# Patient Record
Sex: Female | Born: 1985 | Race: Black or African American | Hispanic: No | Smoking: Never smoker
Health system: Southern US, Community
[De-identification: ages and names within clinical notes are randomized; demographics above are authoritative.]

## PROBLEM LIST (undated history)

## (undated) ENCOUNTER — Inpatient Hospital Stay (HOSPITAL_COMMUNITY): Payer: Self-pay

## (undated) ENCOUNTER — Inpatient Hospital Stay (HOSPITAL_COMMUNITY): Admission: RE | Source: Ambulatory Visit

## (undated) DIAGNOSIS — N390 Urinary tract infection, site not specified: Secondary | ICD-10-CM

## (undated) DIAGNOSIS — R7303 Prediabetes: Secondary | ICD-10-CM

## (undated) DIAGNOSIS — B999 Unspecified infectious disease: Secondary | ICD-10-CM

## (undated) DIAGNOSIS — R7309 Other abnormal glucose: Secondary | ICD-10-CM

## (undated) DIAGNOSIS — B379 Candidiasis, unspecified: Secondary | ICD-10-CM

## (undated) DIAGNOSIS — A749 Chlamydial infection, unspecified: Secondary | ICD-10-CM

## (undated) DIAGNOSIS — L03119 Cellulitis of unspecified part of limb: Secondary | ICD-10-CM

## (undated) HISTORY — DX: Unspecified infectious disease: B99.9

## (undated) HISTORY — PX: INDUCED ABORTION: SHX677

---
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
## 2003-03-12 ENCOUNTER — Encounter: Payer: Self-pay | Admitting: Emergency Medicine

## 2003-03-12 ENCOUNTER — Emergency Department (HOSPITAL_COMMUNITY): Admission: EM | Admit: 2003-03-12 | Discharge: 2003-03-12 | Payer: Self-pay | Admitting: Emergency Medicine

## 2007-09-29 DIAGNOSIS — A749 Chlamydial infection, unspecified: Secondary | ICD-10-CM

## 2007-09-29 HISTORY — DX: Chlamydial infection, unspecified: A74.9

## 2010-02-11 ENCOUNTER — Emergency Department (HOSPITAL_COMMUNITY): Admission: EM | Admit: 2010-02-11 | Discharge: 2010-02-11 | Payer: Self-pay | Admitting: Family Medicine

## 2010-07-30 ENCOUNTER — Other Ambulatory Visit: Admission: RE | Admit: 2010-07-30 | Discharge: 2010-07-30 | Payer: Self-pay | Admitting: Family Medicine

## 2010-08-20 ENCOUNTER — Other Ambulatory Visit: Admission: RE | Admit: 2010-08-20 | Discharge: 2010-08-20 | Payer: Self-pay | Admitting: Family Medicine

## 2010-09-28 DIAGNOSIS — L03119 Cellulitis of unspecified part of limb: Secondary | ICD-10-CM

## 2010-09-28 HISTORY — DX: Cellulitis of unspecified part of limb: L03.119

## 2010-12-15 LAB — POCT I-STAT, CHEM 8
Calcium, Ion: 1.15 mmol/L (ref 1.12–1.32)
Chloride: 106 mEq/L (ref 96–112)
Glucose, Bld: 101 mg/dL — ABNORMAL HIGH (ref 70–99)
HCT: 35 % — ABNORMAL LOW (ref 36.0–46.0)
Hemoglobin: 11.9 g/dL — ABNORMAL LOW (ref 12.0–15.0)
Potassium: 3.9 mEq/L (ref 3.5–5.1)

## 2011-02-05 ENCOUNTER — Ambulatory Visit
Admission: RE | Admit: 2011-02-05 | Discharge: 2011-02-05 | Disposition: A | Discharge status: Home / Self Care | Source: Ambulatory Visit | Attending: Family Medicine | Admitting: Family Medicine

## 2011-02-05 ENCOUNTER — Other Ambulatory Visit: Payer: Self-pay | Admitting: Family Medicine

## 2011-02-05 DIAGNOSIS — R6884 Jaw pain: Secondary | ICD-10-CM

## 2011-02-13 ENCOUNTER — Other Ambulatory Visit: Payer: Self-pay | Admitting: Podiatry

## 2011-06-11 ENCOUNTER — Other Ambulatory Visit (HOSPITAL_COMMUNITY)
Admission: RE | Admit: 2011-06-11 | Discharge: 2011-06-11 | Disposition: A | Discharge status: Home / Self Care | Source: Ambulatory Visit | Attending: Family Medicine | Admitting: Family Medicine

## 2011-06-11 ENCOUNTER — Other Ambulatory Visit: Payer: Self-pay | Admitting: Family Medicine

## 2011-06-11 DIAGNOSIS — Z01419 Encounter for gynecological examination (general) (routine) without abnormal findings: Secondary | ICD-10-CM | POA: Insufficient documentation

## 2012-01-19 ENCOUNTER — Other Ambulatory Visit: Payer: Self-pay | Admitting: Family Medicine

## 2012-01-19 ENCOUNTER — Other Ambulatory Visit (HOSPITAL_COMMUNITY)
Admission: RE | Admit: 2012-01-19 | Discharge: 2012-01-19 | Disposition: A | Discharge status: Home / Self Care | Source: Ambulatory Visit | Attending: Family Medicine | Admitting: Family Medicine

## 2012-01-19 DIAGNOSIS — Z01419 Encounter for gynecological examination (general) (routine) without abnormal findings: Secondary | ICD-10-CM | POA: Insufficient documentation

## 2012-03-17 ENCOUNTER — Encounter (HOSPITAL_COMMUNITY): Payer: Self-pay | Admitting: *Deleted

## 2012-03-17 ENCOUNTER — Inpatient Hospital Stay (HOSPITAL_COMMUNITY)
Admission: AD | Admit: 2012-03-17 | Discharge: 2012-03-17 | Disposition: A | Discharge status: Home / Self Care | Source: Ambulatory Visit | Attending: Obstetrics & Gynecology | Admitting: Obstetrics & Gynecology

## 2012-03-17 DIAGNOSIS — O209 Hemorrhage in early pregnancy, unspecified: Secondary | ICD-10-CM

## 2012-03-17 DIAGNOSIS — Z3201 Encounter for pregnancy test, result positive: Secondary | ICD-10-CM | POA: Insufficient documentation

## 2012-03-17 HISTORY — DX: Urinary tract infection, site not specified: N39.0

## 2012-03-17 HISTORY — DX: Chlamydial infection, unspecified: A74.9

## 2012-03-17 LAB — POCT PREGNANCY, URINE: Preg Test, Ur: POSITIVE — AB

## 2012-03-17 LAB — ABO/RH: ABO/RH(D): O POS

## 2012-03-17 MED ORDER — CONCEPT OB 130-92.4-1 MG PO CAPS: 1.0000 | ORAL_CAPSULE | ORAL | Status: DC

## 2012-03-17 NOTE — Discharge Instructions (Signed)
Vaginal Bleeding During Pregnancy  A small amount of bleeding from the vagina can happen anytime during pregnancy. Be sure to tell your doctor about all vaginal bleeding.   HOME CARE   Get plenty of rest and sleep.   Count the number of pads you use each day. Do not use tampons.   Save any tissue you pass for your doctor to see.   Do not exercise   Do not do any heavy lifting.   Avoid going up and down stairs. If you must climb stairs, go slowly.   Do not have sex (intercourse) or orgasms until approved by your doctor.   Do not douche.   Only take medicine as told by your doctor. Do not take aspirin.   Eat healthy.   Always keep your follow-up appointments.  GET HELP RIGHT AWAY IF:    You feel the baby moving less or not moving at all.   The bleeding gets worse.   You have very painful cramps or pain in your stomach or back.   You pass large clots or anything that looks like tissue.   You have a temperature by mouth above 102 F (38.9 C).   You feel very weak.   You have chills.   You feel dizzy or pass out (faint).   You have a gush of fluid from the vagina.  MAKE SURE YOU:    Understand these instructions.   Will watch your condition.   Will get help right away if you are not doing well or get worse.  Document Released: 06/23/2008 Document Revised: 09/03/2011 Document Reviewed: 08/20/2009  [employer] Patient Information 2012 [employer].

## 2012-03-17 NOTE — MAU Note (Signed)
Poe CNM confirmed pregnancy with heart beat with bedside ultrasound.

## 2012-03-17 NOTE — MAU Note (Addendum)
Took a home test last wk, was positive, just needs to confirm.  Had spotting on Mon, none since

## 2012-03-17 NOTE — MAU Provider Note (Signed)
Annette Jones y.o.[id number] @[redacted]w[redacted]d  by LMP Chief Complaint  Patient presents with  . Possible Pregnancy        SUBJECTIVE  HPI: Wants letter to verify pregnancy. She currently has no problems. Denies pain or bleeding, however she did have some pink streaking on toilet paper one week ago that occurred several times over 2 days. Mrs. Annette Jones. Care at [facility] Past Medical History  Diagnosis Date  . Diabetes mellitus     borderline  . Urinary tract infection   . Cellulitis   . Chlamydia    Past Surgical History  Procedure Date  . Induced abortion    History   Social History  . Marital Status: Married    Spouse Name: N/A    Number of Children: N/A  . Years of Education: N/A   Occupational History  . Not on file.   Social History Main Topics  . Smoking status: Former Smoker    Types: Cigarettes  . Smokeless tobacco: Never Used  . Alcohol Use: No  . Drug Use: No  . Sexually Active: Yes   Other Topics Concern  . Not on file   Social History Narrative  . No narrative on file   No current facility-administered medications on file prior to encounter.   No current outpatient prescriptions on file prior to encounter.   No Known Allergies  ROS: Pertinent items in HPI  OBJECTIVE Blood pressure 122/79, pulse 69, temperature 98 F (36.7 C), temperature source Oral, resp. rate 18, height 5\' 5"  (1.651 m), weight 96.163 kg (212 lb), last menstrual period 01/11/2012.  GENERAL: Well-developed, well-nourished female in no acute distress.  HEENT: Normocephalic, good dentition HEART: normal rate RESP: normal effort ABDOMEN: Soft, nontender EXTREMITIES: Nontender, no edema NEURO: Alert and oriented   LAB RESULTS  @RES24 @ Results for orders placed during the hospital encounter of 03/17/12 (from the past 24 hour(s))  POCT PREGNANCY, URINE     Status: Abnormal   Collection Time   03/17/12  3:45 PM      Component Value Range   Preg Test, Ur POSITIVE (*) NEGATIVE    ABO/RH     Status: Normal   Collection Time   03/17/12  4:15 PM      Component Value Range   ABO/RH(D) O POS     Blood Bank Correction       Value: PREVIOUSLY REPORTED AS: O NEG RESULT MODIFIED ON: [id number] @1900  FLEMINGS.BLDTYP BECK,S RN     IMAGING  Bedside US: pos FHR and fetal activity  ASSESSMENT Early pregnancy Minimal spotting last week        PLAN  Pregnancy verification letter, prenatal vitamin prescription. Start prenatal care at [facility] as soon as possible    Annette Jones 03/17/2012 7:22 PM

## 2012-04-13 ENCOUNTER — Ambulatory Visit (INDEPENDENT_AMBULATORY_CARE_PROVIDER_SITE_OTHER): Payer: Medicaid Other | Admitting: Obstetrics and Gynecology

## 2012-04-13 DIAGNOSIS — R7309 Other abnormal glucose: Secondary | ICD-10-CM

## 2012-04-13 DIAGNOSIS — R7303 Prediabetes: Secondary | ICD-10-CM

## 2012-04-13 DIAGNOSIS — Z331 Pregnant state, incidental: Secondary | ICD-10-CM

## 2012-04-13 LAB — POCT URINALYSIS DIPSTICK
Ketones, UA: NEGATIVE
Spec Grav, UA: 1.015
Urobilinogen, UA: NEGATIVE

## 2012-04-13 NOTE — Progress Notes (Signed)
EXPOSURE TO HAIR DYE 04/12/12

## 2012-04-14 ENCOUNTER — Encounter: Payer: Self-pay | Admitting: Obstetrics and Gynecology

## 2012-04-14 DIAGNOSIS — R7309 Other abnormal glucose: Secondary | ICD-10-CM

## 2012-04-14 HISTORY — DX: Other abnormal glucose: R73.09

## 2012-04-14 LAB — PRENATAL PANEL VII
Basophils Absolute: 0 10*3/uL (ref 0.0–0.1)
Basophils Relative: 0 % (ref 0–1)
Eosinophils Absolute: 0 10*3/uL (ref 0.0–0.7)
HIV: NONREACTIVE
Hemoglobin: 11.5 g/dL — ABNORMAL LOW (ref 12.0–15.0)
Hepatitis B Surface Ag: NEGATIVE
MCH: 27.8 pg (ref 26.0–34.0)
MCHC: 33.9 g/dL (ref 30.0–36.0)
Monocytes Relative: 9 % (ref 3–12)
Neutro Abs: 5 10*3/uL (ref 1.7–7.7)
Neutrophils Relative %: 67 % (ref 43–77)
Platelets: 287 10*3/uL (ref 150–400)

## 2012-04-14 LAB — HEMOGLOBIN A1C: Hgb A1c MFr Bld: 5.9 % — ABNORMAL HIGH (ref <5.7)

## 2012-04-14 LAB — CULTURE, OB URINE: Colony Count: NO GROWTH

## 2012-04-15 LAB — HEMOGLOBINOPATHY EVALUATION
Hemoglobin Other: 0 %
Hgb A2 Quant: 2.8 % (ref 2.2–3.2)
Hgb A: 97.2 % (ref 96.8–97.8)
Hgb F Quant: 0 % (ref 0.0–2.0)
Hgb S Quant: 0 %

## 2012-04-26 ENCOUNTER — Encounter: Payer: Medicaid Other | Admitting: Obstetrics and Gynecology

## 2012-05-02 ENCOUNTER — Encounter: Payer: Self-pay | Admitting: Obstetrics and Gynecology

## 2012-05-02 ENCOUNTER — Ambulatory Visit (INDEPENDENT_AMBULATORY_CARE_PROVIDER_SITE_OTHER): Payer: Medicaid Other | Admitting: Obstetrics and Gynecology

## 2012-05-02 VITALS — BP 100/68 | Wt 216.0 lb

## 2012-05-02 DIAGNOSIS — Z3490 Encounter for supervision of normal pregnancy, unspecified, unspecified trimester: Secondary | ICD-10-CM

## 2012-05-02 DIAGNOSIS — N760 Acute vaginitis: Secondary | ICD-10-CM

## 2012-05-02 DIAGNOSIS — E119 Type 2 diabetes mellitus without complications: Secondary | ICD-10-CM

## 2012-05-02 DIAGNOSIS — Z331 Pregnant state, incidental: Secondary | ICD-10-CM

## 2012-05-02 DIAGNOSIS — A499 Bacterial infection, unspecified: Secondary | ICD-10-CM

## 2012-05-02 LAB — POCT WET PREP (WET MOUNT)
Clue Cells Wet Prep Whiff POC: POSITIVE
WBC, Wet Prep HPF POC: NEGATIVE
pH: 4

## 2012-05-02 LAB — POCT OSOM TRICHOMONAS RAPID TEST: Trichomonas vaginalis: NEGATIVE

## 2012-05-02 LAB — POCT OSOM BVBLUE TEST: Bacterial Vaginosis: POSITIVE

## 2012-05-02 MED ORDER — METRONIDAZOLE 500 MG PO TABS: 500.0000 mg | ORAL_TABLET | Freq: Two times a day (BID) | ORAL | Status: AC

## 2012-05-02 NOTE — Progress Notes (Signed)
Pt transferred from Dr. Tedra [place] office no records will obtain No concerns per pt

## 2012-05-02 NOTE — Progress Notes (Signed)
[redacted]w[redacted]d Subjective:    Annette Jones is being seen today for her first obstetrical visit at [redacted]w[redacted]d gestation by USS with EDD 10/17/12.  She reports increase in Acne on face since pregnancy.  Her obstetrical history is significant for: TAB at 5 weeks  Patient Active Problem List  Diagnosis  . Elevated hemoglobin A1c  Borderline Diabetes prior to pregnancy.  Relationship with FOB:  Involved and supportive  Patient does intend to breast feed.   Pregnancy history fully reviewed.  The following portions of the patient's history were reviewed and updated as appropriate: allergies, current medications, past medical history, past social history, past surgical history and problem list.  Review of Systems Pertinent ROS is described in HPI   Objective:   BP 100/68  Wt 216 lb (97.977 kg)  LMP 01/11/2012 Wt Readings from Last 1 Encounters:  05/02/12 216 lb (97.977 kg)   BMI: There is no height on file to calculate BMI.  General: alert, cooperative and no distress Respiratory: clear to auscultation bilaterally Cardiovascular: regular rate and rhythm, S1, S2 normal, no murmur Breasts:  No dominant masses, nipples erect Gastrointestinal: soft, non-tender; no masses,  no organomegaly Extremities: extremities normal, no pain or edema Vaginal Bleeding: None  EXTERNAL GENITALIA: normal appearing vulva with no masses, tenderness or lesions VAGINA: no abnormal discharge or lesions CERVIX: no lesions or cervical motion tenderness; cervix closed, long, firm UTERUS: gravid and consistent with 16 weeks ADNEXA: no masses palpable and nontender OB EXAM PELVIMETRY: appears adequate   FHR:  145  bpm - Doppler  Assessment:    Pregnancy at  [redacted]w[redacted]d  Plan:     Prenatal panel reviewed and discussed with the patient:Yes. Pap smear collected: done April 2013 - normal GC/Chlamydia collected:done Wet prep:  PH 4.5, Oosum Test - Pos for BV Discussion of Genetic testing options: [facility]  Screen Prenatal vitamins recommended Problem list reviewed and updated.  Plan of care: Follow up in 2 weeks for Early 1hr GTT  Earl Gala CNM, [place] 05/02/2012 4:58 PM

## 2012-05-03 LAB — GC/CHLAMYDIA PROBE AMP, GENITAL: Chlamydia, DNA Probe: NEGATIVE

## 2012-05-16 ENCOUNTER — Ambulatory Visit (INDEPENDENT_AMBULATORY_CARE_PROVIDER_SITE_OTHER): Payer: Medicaid Other | Admitting: Obstetrics and Gynecology

## 2012-05-16 ENCOUNTER — Other Ambulatory Visit: Payer: Medicaid Other

## 2012-05-16 ENCOUNTER — Encounter: Payer: Self-pay | Admitting: Obstetrics and Gynecology

## 2012-05-16 VITALS — BP 100/60 | Wt 221.0 lb

## 2012-05-16 DIAGNOSIS — N898 Other specified noninflammatory disorders of vagina: Secondary | ICD-10-CM

## 2012-05-16 DIAGNOSIS — Z331 Pregnant state, incidental: Secondary | ICD-10-CM

## 2012-05-16 LAB — POCT WET PREP (WET MOUNT): PH, VAGINAL: 4

## 2012-05-16 NOTE — Progress Notes (Signed)
Pt without c/o Pt denies h/o of DM.  States she was borderline.  1 hr gtt today V/v exam WNL wet prep c/w yeast terazol given [place] for anantomy @NV  in 2 weeks Pt with LE swelling left is greater than right.  Neg homans sign B.  Do doppler of LE.  If neg rec supportive measures Doppler negative

## 2012-05-16 NOTE — Progress Notes (Signed)
Pt was given rx for bacterial infection and thinks it caused a yeast infection. Pt c/o vaginal irritation but no discharge.

## 2012-05-17 LAB — AFP, QUAD SCREEN
AFP: 46 IU/mL
Curr Gest Age: 18 wks.days
HCG, Total: 9416 m[IU]/mL
Interpretation-AFP: NEGATIVE
Open Spina bifida: NEGATIVE
uE3 Mom: 0.5
uE3 Value: 0.4 ng/mL

## 2012-05-23 ENCOUNTER — Other Ambulatory Visit: Payer: Self-pay | Admitting: Obstetrics and Gynecology

## 2012-05-23 ENCOUNTER — Ambulatory Visit (INDEPENDENT_AMBULATORY_CARE_PROVIDER_SITE_OTHER): Payer: Medicaid Other | Admitting: Obstetrics and Gynecology

## 2012-05-23 ENCOUNTER — Ambulatory Visit (INDEPENDENT_AMBULATORY_CARE_PROVIDER_SITE_OTHER): Payer: Medicaid Other

## 2012-05-23 ENCOUNTER — Encounter: Payer: Self-pay | Admitting: Obstetrics and Gynecology

## 2012-05-23 VITALS — BP 104/70 | Wt 220.0 lb

## 2012-05-23 DIAGNOSIS — K0889 Other specified disorders of teeth and supporting structures: Secondary | ICD-10-CM

## 2012-05-23 DIAGNOSIS — K089 Disorder of teeth and supporting structures, unspecified: Secondary | ICD-10-CM

## 2012-05-23 DIAGNOSIS — Z3400 Encounter for supervision of normal first pregnancy, unspecified trimester: Secondary | ICD-10-CM

## 2012-05-23 DIAGNOSIS — Z3689 Encounter for other specified antenatal screening: Secondary | ICD-10-CM

## 2012-05-23 NOTE — Progress Notes (Signed)
[redacted]w[redacted]d pt has no concerns.  Ultrasound shows:  Gestational age by [place]     SIUP  S=D     [place] EDD: 10/19/2012            AFI: nl            Cervical length: 4.43 cm            Placenta localization: anterior           Fetal presentation: vertex      Anatomy survey is normal    Anatomy complete:  yes            Gender : [id number] Comments: vertex presentation,anterior placenta (placenta edge is 4.4cm from internal os-normal). Fluid / norma vertical pocket= 4.4cm. No anomalies seen (profile, palate, philtrum, nasal bone, open hands, 5th digit, feet, heels seen. Patient request not to know gender at today's visit.  Normal ovaries, no fluid in CDS, normal adnexa's QUAD SCREEN: nl  DM:  Only dx test done before pregnancy was random cbg.  Pt never took oral agent.  She did have minimally elevated HGBa1c at [facility].    1 hr glucola done at [facility] nl  Rec: [facility] appt for diet.   3 hr gtt at 26-28 weeks   If 3 hr gtt is positive, would do fetal cardiac echo. DENTAL: C/o sore tooth.  Dentist appt recommended

## 2012-05-25 LAB — US OB COMP + 14 WK

## 2012-06-12 ENCOUNTER — Encounter (HOSPITAL_COMMUNITY): Payer: Self-pay | Admitting: *Deleted

## 2012-06-12 ENCOUNTER — Inpatient Hospital Stay (HOSPITAL_COMMUNITY)
Admission: AD | Admit: 2012-06-12 | Discharge: 2012-06-12 | DRG: 781 | Disposition: A | Discharge status: Home / Self Care | Source: Ambulatory Visit | Attending: Obstetrics and Gynecology | Admitting: Obstetrics and Gynecology

## 2012-06-12 DIAGNOSIS — O99810 Abnormal glucose complicating pregnancy: Secondary | ICD-10-CM

## 2012-06-12 DIAGNOSIS — B373 Candidiasis of vulva and vagina: Secondary | ICD-10-CM | POA: Diagnosis present

## 2012-06-12 DIAGNOSIS — E669 Obesity, unspecified: Secondary | ICD-10-CM | POA: Diagnosis present

## 2012-06-12 DIAGNOSIS — B9689 Other specified bacterial agents as the cause of diseases classified elsewhere: Secondary | ICD-10-CM | POA: Diagnosis present

## 2012-06-12 DIAGNOSIS — O99210 Obesity complicating pregnancy, unspecified trimester: Secondary | ICD-10-CM | POA: Diagnosis present

## 2012-06-12 DIAGNOSIS — A499 Bacterial infection, unspecified: Secondary | ICD-10-CM | POA: Diagnosis present

## 2012-06-12 DIAGNOSIS — N760 Acute vaginitis: Secondary | ICD-10-CM | POA: Diagnosis present

## 2012-06-12 DIAGNOSIS — O2390 Unspecified genitourinary tract infection in pregnancy, unspecified trimester: Principal | ICD-10-CM | POA: Diagnosis present

## 2012-06-12 DIAGNOSIS — B3731 Acute candidiasis of vulva and vagina: Secondary | ICD-10-CM | POA: Diagnosis present

## 2012-06-12 DIAGNOSIS — L293 Anogenital pruritus, unspecified: Secondary | ICD-10-CM | POA: Diagnosis present

## 2012-06-12 DIAGNOSIS — R7309 Other abnormal glucose: Secondary | ICD-10-CM | POA: Diagnosis present

## 2012-06-12 LAB — WET PREP, GENITAL

## 2012-06-12 MED ORDER — TERCONAZOLE 0.4 % VA CREA: 1.0000 | TOPICAL_CREAM | Freq: Every day | VAGINAL | Status: AC

## 2012-06-12 NOTE — MAU Note (Signed)
Pt presents for vaginal itching and a yellowish discharge that has some odor.  She states having BV in the middle of August and didn't complete course of medication due to stomach problems as a side effect.  She went to office afterwards and says they were supposed to call her in something for a yeast infection.  She went pick up Rx the next week and the rx was not called in.

## 2012-06-12 NOTE — MAU Provider Note (Signed)
History   Annette Jones is a 26y.o. Obese BF at [redacted]w[redacted]d who presents unannounced w/ CC of itching and "irritation," especially since last night, and "yellowish d/c, with yeasty odor." No fishy odor.  No VB.  IC a few days ago.  Treated in past month or so for BV; thinks she completed the one week course, but had trouble keeping it down.   Positive quickening.  Denies any Resp or gi c/o's today.  No UTI s/s.  No fever or chills.  Works in a group home; worked last night.  \ .. Patient Active Problem List  Diagnosis  . Elevated hemoglobin A1c  . Type 2 diabetes, HbA1C goal < 7%     CSN: [id number]  Arrival date and time: 06/12/12 4098   None     Chief Complaint  Patient presents with  . Vaginal Itching   HPI  OB History    Grav Para Term Preterm Abortions TAB SAB Ect Mult Living   2    1 0          Past Medical History  Diagnosis Date  . Urinary tract infection   . Cellulitis   . Chlamydia   . 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 Infection     UTI X 1  . Infection 2009    CHLAMYDIA  . Infection     HX OF FREQUENT YEAST  . Infection 2012    CELLULITIS    Past Surgical History  Procedure Date  . Induced abortion   . No past surgeries     Family History  Problem Relation Age of Onset  . Other Neg Hx   . Hypertension Mother   . Diabetes Maternal Grandmother   . Hypertension Maternal Grandmother   . Hypertension Cousin     History  Substance Use Topics  . Smoking status: Former Smoker    Types: Cigarettes  . Smokeless tobacco: Never Used  . Alcohol Use: No     2 GLASSES WINE /DAY; LAST DRANK 01/27/2012    Allergies: No Known Allergies  Prescriptions prior to admission  Medication Sig Dispense Refill  . acetaminophen (TYLENOL) 325 MG tablet Take 325 mg by mouth at bedtime as needed. For tooth pain      . benzocaine (ANBESOL) 10 % mucosal gel Use as directed 1 application in the mouth or throat as needed. For tooth pain      . Prenatal Vit-Fe  Fumarate-FA (PRENATAL MULTIVITAMIN) TABS Take 1 tablet by mouth daily.        ROS--see HPI above Physical Exam   Blood pressure 115/78, pulse 75, temperature 97.9 F (36.6 C), resp. rate 18, last menstrual period 01/11/2012.  Physical Exam  Constitutional: She is oriented to person, place, and time. She appears well-developed and well-nourished. No distress.  HENT:  Head: Normocephalic and atraumatic.  Eyes: Pupils are equal, round, and reactive to light.  Cardiovascular: Normal rate.   Respiratory: Effort normal.  GI: Soft.       Gravid; FHT's per RN  Genitourinary:       SSE:  Moderate amt of Thin white d/c in vault, w/ smaller clumps lining walls of vagina.  cx closed.  Positive whiff.  Neurological: She is alert and oriented to person, place, and time.  Skin: Skin is warm and dry.  .. Results for orders placed during the hospital encounter of 06/12/12 (from the past 24 hour(s))  WET PREP, GENITAL     Status: Abnormal  Collection Time   06/12/12  9:45 AM      Component Value Range   Yeast Wet Prep HPF POC MODERATE (*) NONE SEEN   Trich, Wet Prep NONE SEEN  NONE SEEN   Clue Cells Wet Prep HPF POC FEW (*) NONE SEEN   WBC, Wet Prep HPF POC FEW (*) NONE SEEN    MAU Course  Procedures 1.  Wet prep 2. gc/ct  Assessment and Plan  1. [redacted]w[redacted]d 2.  Yeast vaginitis 3.  Obese 4.  Borderline DM 5.  Possible BV  1.  D/c home w/ Rx for Terazol 7; will treat yeast first, and recheck wet prep at 06/20/12 appt for resolution and if persisting clue cells, will treat BV then. 2.  F/u prn any worsening s/s or concerns.  [place] Winslow H 06/12/2012, 10:55 AM

## 2012-06-12 NOTE — MAU Note (Signed)
Pt reports she was dx with BV 2 weeks ago. Did not finish the medication and feels like she has a yeast infection now.

## 2012-06-20 ENCOUNTER — Encounter: Payer: Self-pay | Admitting: Obstetrics and Gynecology

## 2012-06-20 ENCOUNTER — Ambulatory Visit (INDEPENDENT_AMBULATORY_CARE_PROVIDER_SITE_OTHER): Payer: Medicaid Other | Admitting: Obstetrics and Gynecology

## 2012-06-20 VITALS — BP 114/70 | Wt 223.0 lb

## 2012-06-20 DIAGNOSIS — O99810 Abnormal glucose complicating pregnancy: Secondary | ICD-10-CM

## 2012-06-20 DIAGNOSIS — Z331 Pregnant state, incidental: Secondary | ICD-10-CM

## 2012-06-20 NOTE — Progress Notes (Signed)
[redacted]w[redacted]d Pt is concerned if she is gaining enough weight. No other problems.

## 2012-06-20 NOTE — Progress Notes (Signed)
[redacted]w[redacted]d TWG 11# rv'd wgt gain recommendations  rv'd nutrition, increasing proteins and water intake, avoiding sugars C/o constipation, rec colace and miralax rec CBE and BF classes rv'd PTL sx's Will schedule for 3hr gtt in 4wks per [facility],  Also will send for nutrition consult  RTO 4wks

## 2012-06-20 NOTE — Patient Instructions (Signed)
Constipation in Adults Constipation is having fewer than 2 bowel movements per week. Usually, the stools are hard. As we grow older, constipation is more common. If you try to fix constipation with laxatives, the problem may get worse. This is because laxatives taken over a long period of time make the colon muscles weaker. A low-fiber diet, not taking in enough fluids, and taking some medicines may make these problems worse. MEDICATIONS THAT MAY CAUSE CONSTIPATION  Water pills (diuretics).   Calcium channel blockers (used to control blood pressure and for the heart).   Certain pain medicines (narcotics).   Anticholinergics.   Anti-inflammatory agents.   Antacids that contain aluminum.  DISEASES THAT CONTRIBUTE TO CONSTIPATION  Diabetes.   Parkinson's disease.   Dementia.   Stroke.   Depression.   Illnesses that cause problems with salt and water metabolism.  HOME CARE INSTRUCTIONS   Constipation is usually best cared for without medicines. Increasing dietary fiber and eating more fruits and vegetables is the best way to manage constipation.   Slowly increase fiber intake to 25 to 38 grams per day. Whole grains, fruits, vegetables, and legumes are good sources of fiber. A dietitian can further help you incorporate high-fiber foods into your diet.   Drink enough water and fluids to keep your urine clear or pale yellow.   A fiber supplement may be added to your diet if you cannot get enough fiber from foods.   Increasing your activities also helps improve regularity.   Suppositories, as suggested by your caregiver, will also help. If you are using antacids, such as aluminum or calcium containing products, it will be helpful to switch to products containing magnesium if your caregiver says it is okay.   If you have been given a liquid injection (enema) today, this is only a temporary measure. It should not be relied on for treatment of longstanding (chronic) constipation.    Stronger measures, such as magnesium sulfate, should be avoided if possible. This may cause uncontrollable diarrhea. Using magnesium sulfate may not allow you time to make it to the bathroom.  SEEK IMMEDIATE MEDICAL CARE IF:   There is bright red blood in the stool.   The constipation stays for more than 4 days.   There is belly (abdominal) or rectal pain.   You do not seem to be getting better.   You have any questions or concerns.  MAKE SURE YOU:   Understand these instructions.   Will watch your condition.   Will get help right away if you are not doing well or get worse.  Document Released: 06/12/2004 Document Revised: 09/03/2011 Document Reviewed: 08/18/2011 [facility] Patient Information 2012 [place], [place].  High Protein Diet A high protein diet means that high protein foods are added to your diet. Getting more protein in the diet is important for a number of reasons. Protein helps the body to build tissue, muscle, and to repair damage. People who have had surgery, injuries such as broken bones, infections, and burns, or illnesses such as cancer, may need more protein in their diet.  SERVING SIZES Measuring foods and serving sizes helps to make sure you are getting the right amount of food. The list below tells how big or small some common serving sizes are.   1 oz.........4 stacked dice.   3 oz........Marland KitchenDeck of cards.   1 tsp.......Marland KitchenTip of little finger.   1 tbs......Marland KitchenMarland KitchenThumb.   2 tbs.......Marland KitchenGolf ball.    cup......Marland KitchenHalf of a fist.   1 cup.......Marland KitchenA fist.  FOOD SOURCES OF PROTEIN Listed below are some food sources of protein and the amount of protein they contain. Your Registered Dietitian can calculate how many grams of protein you need for your medical condition. High protein foods can be added to the diet at mealtime or as snacks. Be sure to have at least 1 protein-containing food at each meal and snack to ensure adequate intake.  Meats and Meat Substitutes /  Protein (g)  3 oz poultry (chicken, [place]) / 26 g   3 oz tuna, canned in water / 26 g   3 oz fish (cod) / 21 g   3 oz red meat (beef, pork) / 21 g   4 oz tofu / 9 g   1 egg / 6 g    cup egg substitute / 5 g   1 cup dried beans / 15 g   1 cup soy milk / 4 g  Dairy / Protein (g)  1 cup milk (skim, 1%, 2%, whole) / 8 g    cup evaporated milk / 9 g   1 cup buttermilk / 8 g   1 cup low-fat plain yogurt / 11 g   1 cup regular plain yogurt / 9 g    cup cottage cheese / 14 g   1 oz cheddar cheese / 7 g  Nuts / Protein (g)  2 tbs peanut butter / 8 g   1 oz peanuts / 7 g   2 tbs cashews / 5 g   2 tbs almonds / 5 g  Document Released: 09/14/2005 Document Revised: 09/03/2011 Document Reviewed: 06/17/2007 [facility] Patient Information 2012 [place], [place].

## 2012-07-02 ENCOUNTER — Encounter: Payer: Self-pay | Admitting: Obstetrics and Gynecology

## 2012-07-02 DIAGNOSIS — R7309 Other abnormal glucose: Secondary | ICD-10-CM | POA: Insufficient documentation

## 2012-07-18 ENCOUNTER — Other Ambulatory Visit: Payer: Self-pay | Admitting: Obstetrics and Gynecology

## 2012-07-18 ENCOUNTER — Ambulatory Visit (INDEPENDENT_AMBULATORY_CARE_PROVIDER_SITE_OTHER): Payer: Medicaid Other | Admitting: Obstetrics and Gynecology

## 2012-07-18 VITALS — BP 90/56 | Wt 225.0 lb

## 2012-07-18 DIAGNOSIS — Z331 Pregnant state, incidental: Secondary | ICD-10-CM

## 2012-07-18 DIAGNOSIS — Z3490 Encounter for supervision of normal pregnancy, unspecified, unspecified trimester: Secondary | ICD-10-CM

## 2012-07-18 NOTE — Progress Notes (Signed)
[redacted]w[redacted]d Had 3 hr GTT today although 1 hr GTT was normal FH = dates FM+ No change in vaginal secretions ROB x 2 weeks

## 2012-07-18 NOTE — Progress Notes (Signed)
[redacted]w[redacted]d Pt completed 3 gtt today

## 2012-07-19 LAB — GLUCOSE TOLERANCE, 3 HOURS
Glucose Tolerance, 1 hour: 127 mg/dL (ref 70–189)
Glucose Tolerance, 2 hour: 93 mg/dL (ref 70–164)
Glucose Tolerance, Fasting: 86 mg/dL (ref 70–104)
Glucose, GTT - 3 Hour: 85 mg/dL (ref 70–144)

## 2012-08-01 ENCOUNTER — Ambulatory Visit (INDEPENDENT_AMBULATORY_CARE_PROVIDER_SITE_OTHER): Payer: Medicaid Other | Admitting: Obstetrics and Gynecology

## 2012-08-01 ENCOUNTER — Encounter: Payer: Self-pay | Admitting: Obstetrics and Gynecology

## 2012-08-01 VITALS — BP 90/60 | Wt 227.0 lb

## 2012-08-01 DIAGNOSIS — Z331 Pregnant state, incidental: Secondary | ICD-10-CM

## 2012-08-01 NOTE — Progress Notes (Signed)
Three hour gtt WNL [facility] reviewed [redacted]w[redacted]d

## 2012-08-01 NOTE — Patient Instructions (Signed)
Fetal Movement Counts Patient Name: __________________________________________________ Patient Due Date: ____________________ Kick counts is highly recommended in high risk pregnancies, but it is a good idea for every pregnant woman to do. Start counting fetal movements at 28 weeks of the pregnancy. Fetal movements increase after eating a full meal or eating or drinking something sweet (the blood sugar is higher). It is also important to drink plenty of fluids (well hydrated) before doing the count. Lie on your left side because it helps with the circulation or you can sit in a comfortable chair with your arms over your belly (abdomen) with no distractions around you. DOING THE COUNT  Try to do the count the same time of day each time you do it.  Mark the day and time, then see how long it takes for you to feel 10 movements (kicks, flutters, swishes, rolls). You should have at least 10 movements within 2 hours. You will most likely feel 10 movements in much less than 2 hours. If you do not, wait an hour and count again. After a couple of days you will see a pattern.  What you are looking for is a change in the pattern or not enough counts in 2 hours. Is it taking longer in time to reach 10 movements? SEEK MEDICAL CARE IF:  You feel less than 10 counts in 2 hours. Tried twice.  No movement in one hour.  The pattern is changing or taking longer each day to reach 10 counts in 2 hours.  You feel the baby is not moving as it usually does. Date: ____________ Movements: ____________ Start time: ____________ Finish time: ____________  Date: ____________ Movements: ____________ Start time: ____________ Finish time: ____________ Date: ____________ Movements: ____________ Start time: ____________ Finish time: ____________ Date: ____________ Movements: ____________ Start time: ____________ Finish time: ____________ Date: ____________ Movements: ____________ Start time: ____________ Finish time:  ____________ Date: ____________ Movements: ____________ Start time: ____________ Finish time: ____________ Date: ____________ Movements: ____________ Start time: ____________ Finish time: ____________ Date: ____________ Movements: ____________ Start time: ____________ Finish time: ____________  Date: ____________ Movements: ____________ Start time: ____________ Finish time: ____________ Date: ____________ Movements: ____________ Start time: ____________ Finish time: ____________ Date: ____________ Movements: ____________ Start time: ____________ Finish time: ____________ Date: ____________ Movements: ____________ Start time: ____________ Finish time: ____________ Date: ____________ Movements: ____________ Start time: ____________ Finish time: ____________ Date: ____________ Movements: ____________ Start time: ____________ Finish time: ____________ Date: ____________ Movements: ____________ Start time: ____________ Finish time: ____________  Date: ____________ Movements: ____________ Start time: ____________ Finish time: ____________ Date: ____________ Movements: ____________ Start time: ____________ Finish time: ____________ Date: ____________ Movements: ____________ Start time: ____________ Finish time: ____________ Date: ____________ Movements: ____________ Start time: ____________ Finish time: ____________ Date: ____________ Movements: ____________ Start time: ____________ Finish time: ____________ Date: ____________ Movements: ____________ Start time: ____________ Finish time: ____________ Date: ____________ Movements: ____________ Start time: ____________ Finish time: ____________  Date: ____________ Movements: ____________ Start time: ____________ Finish time: ____________ Date: ____________ Movements: ____________ Start time: ____________ Finish time: ____________ Date: ____________ Movements: ____________ Start time: ____________ Finish time: ____________ Date: ____________ Movements:  ____________ Start time: ____________ Finish time: ____________ Date: ____________ Movements: ____________ Start time: ____________ Finish time: ____________ Date: ____________ Movements: ____________ Start time: ____________ Finish time: ____________ Date: ____________ Movements: ____________ Start time: ____________ Finish time: ____________  Date: ____________ Movements: ____________ Start time: ____________ Finish time: ____________ Date: ____________ Movements: ____________ Start time: ____________ Finish time: ____________ Date: ____________ Movements: ____________ Start time: ____________ Finish time: ____________ Date: ____________ Movements:   ____________ Start time: ____________ Finish time: ____________ Date: ____________ Movements: ____________ Start time: ____________ Finish time: ____________ Date: ____________ Movements: ____________ Start time: ____________ Finish time: ____________ Date: ____________ Movements: ____________ Start time: ____________ Finish time: ____________  Date: ____________ Movements: ____________ Start time: ____________ Finish time: ____________ Date: ____________ Movements: ____________ Start time: ____________ Finish time: ____________ Date: ____________ Movements: ____________ Start time: ____________ Finish time: ____________ Date: ____________ Movements: ____________ Start time: ____________ Finish time: ____________ Date: ____________ Movements: ____________ Start time: ____________ Finish time: ____________ Date: ____________ Movements: ____________ Start time: ____________ Finish time: ____________ Date: ____________ Movements: ____________ Start time: ____________ Finish time: ____________  Date: ____________ Movements: ____________ Start time: ____________ Finish time: ____________ Date: ____________ Movements: ____________ Start time: ____________ Finish time: ____________ Date: ____________ Movements: ____________ Start time: ____________ Finish  time: ____________ Date: ____________ Movements: ____________ Start time: ____________ Finish time: ____________ Date: ____________ Movements: ____________ Start time: ____________ Finish time: ____________ Date: ____________ Movements: ____________ Start time: ____________ Finish time: ____________ Date: ____________ Movements: ____________ Start time: ____________ Finish time: ____________  Date: ____________ Movements: ____________ Start time: ____________ Finish time: ____________ Date: ____________ Movements: ____________ Start time: ____________ Finish time: ____________ Date: ____________ Movements: ____________ Start time: ____________ Finish time: ____________ Date: ____________ Movements: ____________ Start time: ____________ Finish time: ____________ Date: ____________ Movements: ____________ Start time: ____________ Finish time: ____________ Date: ____________ Movements: ____________ Start time: ____________ Finish time: ____________ Document Released: 10/14/2006 Document Revised: 12/07/2011 Document Reviewed: 04/16/2009 [employer] Patient Information 2013 [employer].  

## 2012-08-01 NOTE — Progress Notes (Signed)
Pt declines flu shot.  

## 2012-08-16 ENCOUNTER — Encounter: Payer: Self-pay | Admitting: Obstetrics and Gynecology

## 2012-08-16 ENCOUNTER — Ambulatory Visit (INDEPENDENT_AMBULATORY_CARE_PROVIDER_SITE_OTHER): Payer: Medicaid Other | Admitting: Obstetrics and Gynecology

## 2012-08-16 VITALS — BP 102/68 | Wt 228.0 lb

## 2012-08-16 DIAGNOSIS — Z331 Pregnant state, incidental: Secondary | ICD-10-CM

## 2012-08-16 NOTE — Progress Notes (Signed)
Pt c/o pain when standing after she sits for a long period of time. Pt is concerned about how much her baby weighs, thinks she is not gaining enough weight. Declines flu shot.

## 2012-08-16 NOTE — Patient Instructions (Signed)
Round Ligament Pain  The round ligament is made up of muscle and fibrous tissue. It is attached to the uterus near the fallopian tube. The round ligament is located on both sides of the uterus and helps support the position of the uterus. It usually begins in the second trimester of pregnancy when the uterus comes out of the pelvis. The pain can come and go until the baby is delivered. Round ligament pain is not a serious problem and does not cause harm to the baby.  CAUSE  During pregnancy the uterus grows the most from the second trimester to delivery. As it grows, it stretches and slightly twists the round ligaments. When the uterus leans from one side to the other, the round ligament on the opposite side pulls and stretches. This can cause pain.  SYMPTOMS   Pain can occur on one side or both sides. The pain is usually a short, sharp, and pinching-like. Sometimes it can be a dull, lingering and aching pain. The pain is located in the lower side of the abdomen or in the groin. The pain is internal and usually starts deep in the groin and moves up to the outside of the hip area. Pain can occur with:  · Sudden change in position like getting out of bed or a chair.  · Rolling over in bed.  · Coughing or sneezing.  · Walking too much.  · Any type of physical activity.  DIAGNOSIS   Your caregiver will make sure there are no serious problems causing the pain. When nothing serious is found, the symptoms usually indicate that the pain is from the round ligament.  TREATMENT   · Sit down and relax when the pain starts.  · Flex your knees up to your belly.  · Lay on your side with a pillow under your belly (abdomen) and another one between your legs.  · Sit in a hot bath for 15 to 20 minutes or until the pain goes away.  HOME CARE INSTRUCTIONS   · Only take over-the-counter or prescriptions medicines for pain, discomfort or fever as directed by your caregiver.  · Sit and stand slowly.  · Avoid long walks if it causes  pain.  · Stop or lessen your physical activities if it causes pain.  SEEK MEDICAL CARE IF:   · The pain does not go away with any of your treatment.  · You need stronger medication for the pain.  · You develop back pain that you did not have before with the side pain.  SEEK IMMEDIATE MEDICAL CARE IF:   · You develop a temperature of 102° F (38.9° C) or higher.  · You develop uterine contractions.  · You develop vaginal bleeding.  · You develop nausea, vomiting or diarrhea.  · You develop chills.  · You have pain when you urinate.  Document Released: 06/23/2008 Document Revised: 12/07/2011 Document Reviewed: 06/23/2008  [employer]® Patient Information ©2013 [employer].

## 2012-08-16 NOTE — Progress Notes (Signed)
[redacted]w[redacted]d Pt with occ soreness in her groin.  Reviewed round ligament pain Pt does not need [place] @ this time

## 2012-08-18 ENCOUNTER — Telehealth: Payer: Self-pay

## 2012-08-18 DIAGNOSIS — O26849 Uterine size-date discrepancy, unspecified trimester: Secondary | ICD-10-CM

## 2012-08-18 NOTE — Telephone Encounter (Signed)
Message copied by Rolla Plate on Thu Aug 18, 2012  4:04 PM ------      Message from: Henreitta Leber      Created: Tue Aug 16, 2012  1:11 PM      Regarding: schedule ultrasound       Per Dr. Normand Sloop,      Please schedule an ultrasound for history of diabetes, 3 hour GTT- normal (let patient know that it should be covered) and it may be schedule for her next visit. Thank you.      EP

## 2012-08-18 NOTE — Telephone Encounter (Signed)
Spoke with pt per ND need u/s for growth due to h/o diabetes pt has appt for u/s 08/30/12 at 3:00 the f/u with ND pt voice understanding

## 2012-08-30 ENCOUNTER — Ambulatory Visit (INDEPENDENT_AMBULATORY_CARE_PROVIDER_SITE_OTHER): Payer: Medicaid Other

## 2012-08-30 ENCOUNTER — Encounter: Payer: Self-pay | Admitting: Obstetrics and Gynecology

## 2012-08-30 ENCOUNTER — Ambulatory Visit (INDEPENDENT_AMBULATORY_CARE_PROVIDER_SITE_OTHER): Payer: Medicaid Other | Admitting: Obstetrics and Gynecology

## 2012-08-30 VITALS — BP 120/78 | Wt 234.0 lb

## 2012-08-30 DIAGNOSIS — Z331 Pregnant state, incidental: Secondary | ICD-10-CM

## 2012-08-30 DIAGNOSIS — O26849 Uterine size-date discrepancy, unspecified trimester: Secondary | ICD-10-CM

## 2012-08-30 NOTE — Progress Notes (Signed)
[place] EFW 4-6 57 % AFI 14.8 cm cx 3.26 cm vtx with anterior placenta Normal growth no signs of Dm with this pregnancy

## 2012-08-30 NOTE — Patient Instructions (Signed)
Fetal Movement Counts Patient Name: __________________________________________________ Patient Due Date: ____________________ Kick counts is highly recommended in high risk pregnancies, but it is a good idea for every pregnant woman to do. Start counting fetal movements at 28 weeks of the pregnancy. Fetal movements increase after eating a full meal or eating or drinking something sweet (the blood sugar is higher). It is also important to drink plenty of fluids (well hydrated) before doing the count. Lie on your left side because it helps with the circulation or you can sit in a comfortable chair with your arms over your belly (abdomen) with no distractions around you. DOING THE COUNT  Try to do the count the same time of day each time you do it.  Mark the day and time, then see how long it takes for you to feel 10 movements (kicks, flutters, swishes, rolls). You should have at least 10 movements within 2 hours. You will most likely feel 10 movements in much less than 2 hours. If you do not, wait an hour and count again. After a couple of days you will see a pattern.  What you are looking for is a change in the pattern or not enough counts in 2 hours. Is it taking longer in time to reach 10 movements? SEEK MEDICAL CARE IF:  You feel less than 10 counts in 2 hours. Tried twice.  No movement in one hour.  The pattern is changing or taking longer each day to reach 10 counts in 2 hours.  You feel the baby is not moving as it usually does. Date: ____________ Movements: ____________ Start time: ____________ Finish time: ____________  Date: ____________ Movements: ____________ Start time: ____________ Finish time: ____________ Date: ____________ Movements: ____________ Start time: ____________ Finish time: ____________ Date: ____________ Movements: ____________ Start time: ____________ Finish time: ____________ Date: ____________ Movements: ____________ Start time: ____________ Finish time:  ____________ Date: ____________ Movements: ____________ Start time: ____________ Finish time: ____________ Date: ____________ Movements: ____________ Start time: ____________ Finish time: ____________ Date: ____________ Movements: ____________ Start time: ____________ Finish time: ____________  Date: ____________ Movements: ____________ Start time: ____________ Finish time: ____________ Date: ____________ Movements: ____________ Start time: ____________ Finish time: ____________ Date: ____________ Movements: ____________ Start time: ____________ Finish time: ____________ Date: ____________ Movements: ____________ Start time: ____________ Finish time: ____________ Date: ____________ Movements: ____________ Start time: ____________ Finish time: ____________ Date: ____________ Movements: ____________ Start time: ____________ Finish time: ____________ Date: ____________ Movements: ____________ Start time: ____________ Finish time: ____________  Date: ____________ Movements: ____________ Start time: ____________ Finish time: ____________ Date: ____________ Movements: ____________ Start time: ____________ Finish time: ____________ Date: ____________ Movements: ____________ Start time: ____________ Finish time: ____________ Date: ____________ Movements: ____________ Start time: ____________ Finish time: ____________ Date: ____________ Movements: ____________ Start time: ____________ Finish time: ____________ Date: ____________ Movements: ____________ Start time: ____________ Finish time: ____________ Date: ____________ Movements: ____________ Start time: ____________ Finish time: ____________  Date: ____________ Movements: ____________ Start time: ____________ Finish time: ____________ Date: ____________ Movements: ____________ Start time: ____________ Finish time: ____________ Date: ____________ Movements: ____________ Start time: ____________ Finish time: ____________ Date: ____________ Movements:  ____________ Start time: ____________ Finish time: ____________ Date: ____________ Movements: ____________ Start time: ____________ Finish time: ____________ Date: ____________ Movements: ____________ Start time: ____________ Finish time: ____________ Date: ____________ Movements: ____________ Start time: ____________ Finish time: ____________  Date: ____________ Movements: ____________ Start time: ____________ Finish time: ____________ Date: ____________ Movements: ____________ Start time: ____________ Finish time: ____________ Date: ____________ Movements: ____________ Start time: ____________ Finish time: ____________ Date: ____________ Movements:   ____________ Start time: ____________ Finish time: ____________ Date: ____________ Movements: ____________ Start time: ____________ Finish time: ____________ Date: ____________ Movements: ____________ Start time: ____________ Finish time: ____________ Date: ____________ Movements: ____________ Start time: ____________ Finish time: ____________  Date: ____________ Movements: ____________ Start time: ____________ Finish time: ____________ Date: ____________ Movements: ____________ Start time: ____________ Finish time: ____________ Date: ____________ Movements: ____________ Start time: ____________ Finish time: ____________ Date: ____________ Movements: ____________ Start time: ____________ Finish time: ____________ Date: ____________ Movements: ____________ Start time: ____________ Finish time: ____________ Date: ____________ Movements: ____________ Start time: ____________ Finish time: ____________ Date: ____________ Movements: ____________ Start time: ____________ Finish time: ____________  Date: ____________ Movements: ____________ Start time: ____________ Finish time: ____________ Date: ____________ Movements: ____________ Start time: ____________ Finish time: ____________ Date: ____________ Movements: ____________ Start time: ____________ Finish  time: ____________ Date: ____________ Movements: ____________ Start time: ____________ Finish time: ____________ Date: ____________ Movements: ____________ Start time: ____________ Finish time: ____________ Date: ____________ Movements: ____________ Start time: ____________ Finish time: ____________ Date: ____________ Movements: ____________ Start time: ____________ Finish time: ____________  Date: ____________ Movements: ____________ Start time: ____________ Finish time: ____________ Date: ____________ Movements: ____________ Start time: ____________ Finish time: ____________ Date: ____________ Movements: ____________ Start time: ____________ Finish time: ____________ Date: ____________ Movements: ____________ Start time: ____________ Finish time: ____________ Date: ____________ Movements: ____________ Start time: ____________ Finish time: ____________ Date: ____________ Movements: ____________ Start time: ____________ Finish time: ____________ Document Released: 10/14/2006 Document Revised: 12/07/2011 Document Reviewed: 04/16/2009 [employer] Patient Information 2013 [employer].  

## 2012-08-30 NOTE — Progress Notes (Signed)
Pt declines flu shot.  

## 2012-09-02 LAB — US OB FOLLOW UP

## 2012-09-13 ENCOUNTER — Encounter: Payer: Self-pay | Admitting: Obstetrics and Gynecology

## 2012-09-13 ENCOUNTER — Ambulatory Visit (INDEPENDENT_AMBULATORY_CARE_PROVIDER_SITE_OTHER): Payer: Medicaid Other | Admitting: Obstetrics and Gynecology

## 2012-09-13 ENCOUNTER — Other Ambulatory Visit (INDEPENDENT_AMBULATORY_CARE_PROVIDER_SITE_OTHER): Payer: Medicaid Other

## 2012-09-13 VITALS — BP 120/70 | Wt 234.0 lb

## 2012-09-13 DIAGNOSIS — O26899 Other specified pregnancy related conditions, unspecified trimester: Secondary | ICD-10-CM

## 2012-09-13 DIAGNOSIS — Z3490 Encounter for supervision of normal pregnancy, unspecified, unspecified trimester: Secondary | ICD-10-CM

## 2012-09-13 DIAGNOSIS — Z3685 Encounter for antenatal screening for Streptococcus B: Secondary | ICD-10-CM

## 2012-09-13 DIAGNOSIS — Z331 Pregnant state, incidental: Secondary | ICD-10-CM

## 2012-09-13 DIAGNOSIS — Z113 Encounter for screening for infections with a predominantly sexual mode of transmission: Secondary | ICD-10-CM

## 2012-09-13 DIAGNOSIS — N898 Other specified noninflammatory disorders of vagina: Secondary | ICD-10-CM

## 2012-09-13 LAB — POCT WET PREP (WET MOUNT): Clue Cells Wet Prep Whiff POC: NEGATIVE

## 2012-09-13 NOTE — Progress Notes (Signed)
[redacted]w[redacted]d ?LOF Ph 4.5, - whiff, -N, -P spec with small amount of clear fluid U/S AFI 16.9cm, vtx, nl fluid RTO 1wk GBS/GC/CT all done today with consent FKCs Note for work secondary to cold for today and tomorrow

## 2012-09-14 ENCOUNTER — Telehealth: Payer: Self-pay | Admitting: Obstetrics and Gynecology

## 2012-09-14 NOTE — Telephone Encounter (Signed)
Tc to pt regarding msg.  Pt states it feels like she has fluid in her knees and feels as if she has a fever but has not checked her temperature, pt says she feels hot.  Pt states was told yesterday that she has a sinus infection and told to take Tylenol and particular type of cold medicine.  Pt reports +fm.  Pt advised to elevate feet above heart level, push fluids, rest, get a thermometer and take temp.  If temp 100.4 or greater to call office.  Pt voices agreement.

## 2012-09-16 ENCOUNTER — Telehealth: Payer: Self-pay

## 2012-09-16 LAB — CULTURE, BETA STREP (GROUP B ONLY)

## 2012-09-19 LAB — US OB LIMITED

## 2012-09-23 ENCOUNTER — Encounter: Payer: Self-pay | Admitting: Obstetrics and Gynecology

## 2012-09-23 ENCOUNTER — Ambulatory Visit (INDEPENDENT_AMBULATORY_CARE_PROVIDER_SITE_OTHER): Payer: Medicaid Other | Admitting: Obstetrics and Gynecology

## 2012-09-23 ENCOUNTER — Encounter (HOSPITAL_COMMUNITY): Payer: Self-pay | Admitting: *Deleted

## 2012-09-23 ENCOUNTER — Inpatient Hospital Stay (HOSPITAL_COMMUNITY)
Admission: AD | Admit: 2012-09-23 | Discharge: 2012-09-23 | Disposition: A | Discharge status: Home / Self Care | Source: Ambulatory Visit | Attending: Obstetrics and Gynecology | Admitting: Obstetrics and Gynecology

## 2012-09-23 VITALS — BP 112/80 | Wt 241.0 lb

## 2012-09-23 DIAGNOSIS — O99891 Other specified diseases and conditions complicating pregnancy: Secondary | ICD-10-CM | POA: Insufficient documentation

## 2012-09-23 DIAGNOSIS — O210 Mild hyperemesis gravidarum: Secondary | ICD-10-CM | POA: Insufficient documentation

## 2012-09-23 DIAGNOSIS — R7309 Other abnormal glucose: Secondary | ICD-10-CM | POA: Insufficient documentation

## 2012-09-23 DIAGNOSIS — Z331 Pregnant state, incidental: Secondary | ICD-10-CM

## 2012-09-23 DIAGNOSIS — E119 Type 2 diabetes mellitus without complications: Secondary | ICD-10-CM

## 2012-09-23 DIAGNOSIS — O219 Vomiting of pregnancy, unspecified: Secondary | ICD-10-CM

## 2012-09-23 LAB — URINALYSIS, ROUTINE W REFLEX MICROSCOPIC
Glucose, UA: NEGATIVE mg/dL
Ketones, ur: 15 mg/dL — AB
Nitrite: NEGATIVE
Protein, ur: NEGATIVE mg/dL

## 2012-09-23 LAB — URINE MICROSCOPIC-ADD ON

## 2012-09-23 MED ORDER — DEXTROSE 5 % IN LACTATED RINGERS IV BOLUS
1000.0000 mL | Freq: Once | INTRAVENOUS | Status: AC
  Administered 2012-09-23: 1000 mL via INTRAVENOUS

## 2012-09-23 MED ORDER — ONDANSETRON HCL 4 MG/2ML IJ SOLN: 4.0000 mg | Freq: Once | INTRAMUSCULAR | Status: DC

## 2012-09-23 MED ORDER — ONDANSETRON 8 MG PO TBDP
8.0000 mg | ORAL_TABLET | Freq: Once | ORAL | Status: AC
  Administered 2012-09-23: 8 mg via ORAL
  Filled 2012-09-23: qty 1

## 2012-09-23 MED ORDER — ONDANSETRON 8 MG PO TBDP: 8.0000 mg | ORAL_TABLET | Freq: Three times a day (TID) | ORAL | Status: DC | PRN

## 2012-09-23 MED ORDER — PANTOPRAZOLE SODIUM 40 MG IV SOLR
40.0000 mg | Freq: Once | INTRAVENOUS | Status: AC
  Administered 2012-09-23: 40 mg via INTRAVENOUS
  Filled 2012-09-23: qty 40

## 2012-09-23 NOTE — Progress Notes (Signed)
Pt c/o back pain and nausea, was seen at hospital this morning for nausea. Pt declines cervix check.

## 2012-09-23 NOTE — Patient Instructions (Signed)
Nausea and Vomiting Nausea is a sick feeling that often comes before throwing up (vomiting). Vomiting is a reflex where stomach contents come out of your mouth. Vomiting can cause severe loss of body fluids (dehydration). Children and elderly adults can become dehydrated quickly, especially if they also have diarrhea. Nausea and vomiting are symptoms of a condition or disease. It is important to find the cause of your symptoms. CAUSES   Direct irritation of the stomach lining. This irritation can result from increased acid production (gastroesophageal reflux disease), infection, food poisoning, taking certain medicines (such as nonsteroidal anti-inflammatory drugs), alcohol use, or tobacco use.  Signals from the brain.These signals could be caused by a headache, heat exposure, an inner ear disturbance, increased pressure in the brain from injury, infection, a tumor, or a concussion, pain, emotional stimulus, or metabolic problems.  An obstruction in the gastrointestinal tract (bowel obstruction).  Illnesses such as diabetes, hepatitis, gallbladder problems, appendicitis, kidney problems, cancer, sepsis, atypical symptoms of a heart attack, or eating disorders.  Medical treatments such as chemotherapy and radiation.  Receiving medicine that makes you sleep (general anesthetic) during surgery. DIAGNOSIS Your caregiver may ask for tests to be done if the problems do not improve after a few days. Tests may also be done if symptoms are severe or if the reason for the nausea and vomiting is not clear. Tests may include:  Urine tests.  Blood tests.  Stool tests.  Cultures (to look for evidence of infection).  X-rays or other imaging studies. Test results can help your caregiver make decisions about treatment or the need for additional tests. TREATMENT You need to stay well hydrated. Drink frequently but in small amounts.You may wish to drink water, sports drinks, clear broth, or eat frozen  ice pops or gelatin dessert to help stay hydrated.When you eat, eating slowly may help prevent nausea.There are also some antinausea medicines that may help prevent nausea. HOME CARE INSTRUCTIONS   Take all medicine as directed by your caregiver.  If you do not have an appetite, do not force yourself to eat. However, you must continue to drink fluids.  If you have an appetite, eat a normal diet unless your caregiver tells you differently.  Eat a variety of complex carbohydrates (rice, wheat, potatoes, bread), lean meats, yogurt, fruits, and vegetables.  Avoid high-fat foods because they are more difficult to digest.  Drink enough water and fluids to keep your urine clear or pale yellow.  If you are dehydrated, ask your caregiver for specific rehydration instructions. Signs of dehydration may include:  Severe thirst.  Dry lips and mouth.  Dizziness.  Dark urine.  Decreasing urine frequency and amount.  Confusion.  Rapid breathing or pulse. SEEK IMMEDIATE MEDICAL CARE IF:   You have blood or brown flecks (like coffee grounds) in your vomit.  You have black or bloody stools.  You have a severe headache or stiff neck.  You are confused.  You have severe abdominal pain.  You have chest pain or trouble breathing.  You do not urinate at least once every 8 hours.  You develop cold or clammy skin.  You continue to vomit for longer than 24 to 48 hours.  You have a fever. MAKE SURE YOU:   Understand these instructions.  Will watch your condition.  Will get help right away if you are not doing well or get worse. Document Released: 09/14/2005 Document Revised: 12/07/2011 Document Reviewed: 02/11/2011 Exit[employer] Patient Information 2013 [employer].  

## 2012-09-23 NOTE — MAU Note (Signed)
Pt reports she started feeling nauseated about 3 hours ago and then started vomiting about 2 hours,denies diarrhea, fever, vaginal bleeding or other complaints

## 2012-09-23 NOTE — Progress Notes (Signed)
[redacted]w[redacted]d Pt seen in MAU this morning for n/v.  She has not filled zofran yet Told to call if she can not tolerate any food or drink for 24 hrs with the zofran Pt declined cx exam

## 2012-09-28 NOTE — L&D Delivery Note (Signed)
Delivery Note Dr. Su Hilt at bedside and discussed vacuum delivery at onset of pushing. At 4:32 AM a viable female was delivered via Vaginal, Spontaneous Delivery (LOA Presentation). Easy delivery of the shoulders, crying, bulb suction mouth and nares, cord doubly clamped and baby taken to NICU at warm. APGAR: 9, 9; weight .   Placenta status:  Cord: 3 vessels.  Anesthesia: Epidural  Episiotomy:none Lacerations: R sulcus tear, R vag lac Suture Repair: vicryl 4.0 Est. Blood Loss (mL): 350  Mom to postpartum.  Baby to rooming in.  Vershawn Westrup 10/17/2012, 4:49 AM

## 2012-10-03 ENCOUNTER — Ambulatory Visit (INDEPENDENT_AMBULATORY_CARE_PROVIDER_SITE_OTHER): Payer: Medicaid Other | Admitting: Obstetrics and Gynecology

## 2012-10-03 ENCOUNTER — Encounter: Payer: Self-pay | Admitting: Obstetrics and Gynecology

## 2012-10-03 ENCOUNTER — Telehealth: Payer: Self-pay | Admitting: Obstetrics and Gynecology

## 2012-10-03 VITALS — BP 118/82 | Wt 234.0 lb

## 2012-10-03 DIAGNOSIS — Z331 Pregnant state, incidental: Secondary | ICD-10-CM

## 2012-10-03 NOTE — Progress Notes (Signed)
Pt c/o brown discharge.

## 2012-10-03 NOTE — Patient Instructions (Signed)

## 2012-10-03 NOTE — Progress Notes (Signed)
[redacted]w[redacted]d Speculum exam Brownish discharge, whiff neg, sperm on slide Pt desires to have metrogel for the odor.

## 2012-10-03 NOTE — Telephone Encounter (Signed)
LM to call at 3:44 ld

## 2012-10-04 ENCOUNTER — Telehealth: Payer: Self-pay

## 2012-10-04 NOTE — Telephone Encounter (Signed)
LMTC  ld 

## 2012-10-06 ENCOUNTER — Telehealth: Payer: Self-pay | Admitting: Advanced Practice Midwife

## 2012-10-06 NOTE — Telephone Encounter (Signed)
Asking how much ES Tylenol she can take for toothache. No fever. May take 1000 mg (2 tabs) Q6 PRN. Call dentists in am. Dentist may call office for any questions RE: Tx, Rx.

## 2012-10-10 ENCOUNTER — Encounter: Payer: Self-pay | Admitting: Obstetrics and Gynecology

## 2012-10-10 ENCOUNTER — Ambulatory Visit (INDEPENDENT_AMBULATORY_CARE_PROVIDER_SITE_OTHER): Payer: Medicaid Other | Admitting: Obstetrics and Gynecology

## 2012-10-10 VITALS — BP 122/80 | Wt 237.0 lb

## 2012-10-10 DIAGNOSIS — Z331 Pregnant state, incidental: Secondary | ICD-10-CM

## 2012-10-10 NOTE — Progress Notes (Signed)
Pt c/o increase in back pain, requests cervix check today.

## 2012-10-10 NOTE — Patient Instructions (Signed)

## 2012-10-10 NOTE — Progress Notes (Signed)
[redacted]w[redacted]d A/P GBS negative Fetal kick counts reviewed Labor reviewed with pt All patients  questions answered

## 2012-10-16 ENCOUNTER — Telehealth: Payer: Self-pay | Admitting: Obstetrics and Gynecology

## 2012-10-16 ENCOUNTER — Encounter (HOSPITAL_COMMUNITY): Payer: Self-pay

## 2012-10-16 ENCOUNTER — Inpatient Hospital Stay (HOSPITAL_COMMUNITY)
Admission: AD | Admit: 2012-10-16 | Discharge: 2012-10-19 | DRG: 775 | Disposition: A | Discharge status: Home / Self Care | Payer: Medicaid Other | Source: Ambulatory Visit | Attending: Obstetrics and Gynecology | Admitting: Obstetrics and Gynecology

## 2012-10-16 DIAGNOSIS — R7309 Other abnormal glucose: Secondary | ICD-10-CM

## 2012-10-16 DIAGNOSIS — Z22330 Carrier of Group B streptococcus: Secondary | ICD-10-CM

## 2012-10-16 DIAGNOSIS — O99892 Other specified diseases and conditions complicating childbirth: Secondary | ICD-10-CM | POA: Diagnosis present

## 2012-10-16 HISTORY — DX: Other abnormal glucose: R73.09

## 2012-10-16 NOTE — Telephone Encounter (Signed)
Pt called, having cramping this morning, noticed bloody show, no heavy bleeding, no LOF, GFM. Enc to continue observing, call if heavy bleeding or dec FM or in labor.

## 2012-10-16 NOTE — MAU Note (Signed)
Contractions every 2-3 minutes since 8pm tonight. Denies leaking of fluid or vaginal bleeding.

## 2012-10-17 ENCOUNTER — Inpatient Hospital Stay (HOSPITAL_COMMUNITY): Payer: Medicaid Other | Admitting: Anesthesiology

## 2012-10-17 ENCOUNTER — Encounter (HOSPITAL_COMMUNITY): Payer: Self-pay | Admitting: *Deleted

## 2012-10-17 ENCOUNTER — Encounter (HOSPITAL_COMMUNITY): Payer: Self-pay | Admitting: Anesthesiology

## 2012-10-17 LAB — COMPREHENSIVE METABOLIC PANEL
AST: 21 U/L (ref 0–37)
Albumin: 3.2 g/dL — ABNORMAL LOW (ref 3.5–5.2)
Alkaline Phosphatase: 127 U/L — ABNORMAL HIGH (ref 39–117)
BUN: 7 mg/dL (ref 6–23)
Chloride: 98 mEq/L (ref 96–112)
Creatinine, Ser: 0.71 mg/dL (ref 0.50–1.10)
Potassium: 4 mEq/L (ref 3.5–5.1)
Total Bilirubin: 0.2 mg/dL — ABNORMAL LOW (ref 0.3–1.2)
Total Protein: 7.2 g/dL (ref 6.0–8.3)

## 2012-10-17 LAB — URINALYSIS, ROUTINE W REFLEX MICROSCOPIC
Glucose, UA: NEGATIVE mg/dL
Leukocytes, UA: NEGATIVE
Specific Gravity, Urine: <1.005 — ABNORMAL LOW (ref 1.005–1.030)
pH: 6.5 (ref 5.0–8.0)

## 2012-10-17 LAB — LACTATE DEHYDROGENASE: LDH: 204 U/L (ref 94–250)

## 2012-10-17 LAB — URIC ACID: Uric Acid, Serum: 3.3 mg/dL (ref 2.4–7.0)

## 2012-10-17 LAB — CBC
HCT: 34 % — ABNORMAL LOW (ref 36.0–46.0)
Hemoglobin: 11.6 g/dL — ABNORMAL LOW (ref 12.0–15.0)
MCHC: 34.1 g/dL (ref 30.0–36.0)
RBC: 4.11 MIL/uL (ref 3.87–5.11)
WBC: 9.4 10*3/uL (ref 4.0–10.5)

## 2012-10-17 LAB — TYPE AND SCREEN
ABO/RH(D): O POS
Antibody Screen: NEGATIVE

## 2012-10-17 LAB — RPR: RPR Ser Ql: NONREACTIVE

## 2012-10-17 MED ORDER — WITCH HAZEL-GLYCERIN EX PADS: 1.0000 "application " | MEDICATED_PAD | CUTANEOUS | Status: DC | PRN

## 2012-10-17 MED ORDER — PRENATAL MULTIVITAMIN CH
1.0000 | ORAL_TABLET | Freq: Every day | ORAL | Status: DC
  Administered 2012-10-17 – 2012-10-19 (×3): 1 via ORAL
  Filled 2012-10-17 (×3): qty 1

## 2012-10-17 MED ORDER — ONDANSETRON HCL 4 MG/2ML IJ SOLN
4.0000 mg | Freq: Four times a day (QID) | INTRAMUSCULAR | Status: DC | PRN
  Filled 2012-10-17: qty 2

## 2012-10-17 MED ORDER — IBUPROFEN 600 MG PO TABS
600.0000 mg | ORAL_TABLET | Freq: Four times a day (QID) | ORAL | Status: DC
  Administered 2012-10-17 – 2012-10-19 (×8): 600 mg via ORAL
  Filled 2012-10-17 (×9): qty 1

## 2012-10-17 MED ORDER — BENZOCAINE-MENTHOL 20-0.5 % EX AERO
1.0000 "application " | INHALATION_SPRAY | CUTANEOUS | Status: DC | PRN
  Administered 2012-10-17: 1 via TOPICAL
  Filled 2012-10-17: qty 56

## 2012-10-17 MED ORDER — LACTATED RINGERS IV SOLN: 500.0000 mL | INTRAVENOUS | Status: DC | PRN

## 2012-10-17 MED ORDER — ZOLPIDEM TARTRATE 5 MG PO TABS: 5.0000 mg | ORAL_TABLET | Freq: Every evening | ORAL | Status: DC | PRN

## 2012-10-17 MED ORDER — ACETAMINOPHEN 325 MG PO TABS: 650.0000 mg | ORAL_TABLET | ORAL | Status: DC | PRN

## 2012-10-17 MED ORDER — OXYCODONE-ACETAMINOPHEN 5-325 MG PO TABS: 1.0000 | ORAL_TABLET | ORAL | Status: DC | PRN

## 2012-10-17 MED ORDER — PHENYLEPHRINE 40 MCG/ML (10ML) SYRINGE FOR IV PUSH (FOR BLOOD PRESSURE SUPPORT)
80.0000 ug | PREFILLED_SYRINGE | INTRAVENOUS | Status: DC | PRN
  Filled 2012-10-17: qty 5

## 2012-10-17 MED ORDER — LIDOCAINE HCL (PF) 1 % IJ SOLN
30.0000 mL | INTRAMUSCULAR | Status: DC | PRN
  Filled 2012-10-17: qty 30

## 2012-10-17 MED ORDER — SIMETHICONE 80 MG PO CHEW: 80.0000 mg | CHEWABLE_TABLET | ORAL | Status: DC | PRN

## 2012-10-17 MED ORDER — OXYTOCIN 40 UNITS IN LACTATED RINGERS INFUSION - SIMPLE MED
62.5000 mL/h | INTRAVENOUS | Status: DC
Start: 2012-10-17 — End: 2012-10-17
  Administered 2012-10-17: 62.5 mL/h via INTRAVENOUS
  Filled 2012-10-17: qty 1000

## 2012-10-17 MED ORDER — ONDANSETRON HCL 4 MG PO TABS: 4.0000 mg | ORAL_TABLET | ORAL | Status: DC | PRN

## 2012-10-17 MED ORDER — DIBUCAINE 1 % RE OINT: 1.0000 "application " | TOPICAL_OINTMENT | RECTAL | Status: DC | PRN

## 2012-10-17 MED ORDER — DIPHENHYDRAMINE HCL 50 MG/ML IJ SOLN: 12.5000 mg | INTRAMUSCULAR | Status: DC | PRN

## 2012-10-17 MED ORDER — OXYTOCIN 10 UNIT/ML IJ SOLN
INTRAMUSCULAR | Status: AC
  Administered 2012-10-17: 10 [IU]
  Filled 2012-10-17: qty 1

## 2012-10-17 MED ORDER — LANOLIN HYDROUS EX OINT: TOPICAL_OINTMENT | CUTANEOUS | Status: DC | PRN

## 2012-10-17 MED ORDER — OXYTOCIN BOLUS FROM INFUSION: 500.0000 mL | INTRAVENOUS | Status: DC

## 2012-10-17 MED ORDER — SODIUM BICARBONATE 8.4 % IV SOLN
INTRAVENOUS | Status: DC | PRN
  Administered 2012-10-17: 5 mL via EPIDURAL

## 2012-10-17 MED ORDER — EPHEDRINE 5 MG/ML INJ
10.0000 mg | INTRAVENOUS | Status: DC | PRN
  Filled 2012-10-17: qty 4

## 2012-10-17 MED ORDER — FERROUS SULFATE 325 (65 FE) MG PO TABS
325.0000 mg | ORAL_TABLET | Freq: Two times a day (BID) | ORAL | Status: DC
  Administered 2012-10-17 – 2012-10-19 (×4): 325 mg via ORAL
  Filled 2012-10-17 (×4): qty 1

## 2012-10-17 MED ORDER — PHENYLEPHRINE 40 MCG/ML (10ML) SYRINGE FOR IV PUSH (FOR BLOOD PRESSURE SUPPORT): 80.0000 ug | PREFILLED_SYRINGE | INTRAVENOUS | Status: DC | PRN

## 2012-10-17 MED ORDER — LACTATED RINGERS IV SOLN
INTRAVENOUS | Status: DC
  Administered 2012-10-17: via INTRAVENOUS

## 2012-10-17 MED ORDER — DIPHENHYDRAMINE HCL 25 MG PO CAPS: 25.0000 mg | ORAL_CAPSULE | Freq: Four times a day (QID) | ORAL | Status: DC | PRN

## 2012-10-17 MED ORDER — CITRIC ACID-SODIUM CITRATE 334-500 MG/5ML PO SOLN: 30.0000 mL | ORAL | Status: DC | PRN

## 2012-10-17 MED ORDER — TETANUS-DIPHTH-ACELL PERTUSSIS 5-2.5-18.5 LF-MCG/0.5 IM SUSP
0.5000 mL | Freq: Once | INTRAMUSCULAR | Status: AC
  Administered 2012-10-18: 0.5 mL via INTRAMUSCULAR

## 2012-10-17 MED ORDER — LACTATED RINGERS IV SOLN: 500.0000 mL | Freq: Once | INTRAVENOUS | Status: DC

## 2012-10-17 MED ORDER — EPHEDRINE 5 MG/ML INJ
10.0000 mg | INTRAVENOUS | Status: DC | PRN
  Administered 2012-10-17: 10 mg via INTRAVENOUS

## 2012-10-17 MED ORDER — MEASLES, MUMPS & RUBELLA VAC ~~LOC~~ INJ
0.5000 mL | INJECTION | Freq: Once | SUBCUTANEOUS | Status: DC
  Filled 2012-10-17: qty 0.5

## 2012-10-17 MED ORDER — ONDANSETRON HCL 4 MG/2ML IJ SOLN: 4.0000 mg | INTRAMUSCULAR | Status: DC | PRN

## 2012-10-17 MED ORDER — IBUPROFEN 600 MG PO TABS
600.0000 mg | ORAL_TABLET | Freq: Four times a day (QID) | ORAL | Status: DC | PRN
  Filled 2012-10-17: qty 1

## 2012-10-17 MED ORDER — FENTANYL 2.5 MCG/ML BUPIVACAINE 1/10 % EPIDURAL INFUSION (WH - ANES)
14.0000 mL/h | INTRAMUSCULAR | Status: DC
  Administered 2012-10-17: 14 mL/h via EPIDURAL
  Filled 2012-10-17: qty 125

## 2012-10-17 MED ORDER — SENNOSIDES-DOCUSATE SODIUM 8.6-50 MG PO TABS
2.0000 | ORAL_TABLET | Freq: Every day | ORAL | Status: DC
  Administered 2012-10-18 (×2): 2 via ORAL

## 2012-10-17 NOTE — Progress Notes (Signed)
Comfortable, some pressure O BP 137/93  Pulse 120  Temp 97.3 F (36.3 C) (Oral)  Resp 18  Ht 5\' 5"  (1.651 m)  Wt 243 lb (110.224 kg)  BMI 40.44 kg/m2  SpO2 100%  LMP 01/11/2012      fhts with variable decels with hypotension      abd soft between uc      Contractions mod q 2-4      Vag C +1 arm mod meconium scalp electrode placed easily with pt consent to sims position pih labs wnl ua pending A [redacted]w[redacted]d 2nd stage meconium P NICU at delivery, continue care Lavera Guise, CNM

## 2012-10-17 NOTE — H&P (Signed)
Annette Jones is a 27 y.o. female presenting for uc stronger at 830 pm, denies srom, with bloody mucus, with +FM, no ha, visual spots, or blurring, no upper abd pain, denies edema Maternal Medical History:  Reason for admission: Reason for admission: contractions.  Contractions: Onset was 3-5 hours ago.   Frequency: regular.   Perceived severity is moderate.    Fetal activity: Perceived fetal activity is normal.   Last perceived fetal movement was within the past hour.    Prenatal complications: No bleeding.     OB History    Grav Para Term Preterm Abortions TAB SAB Ect Mult Living   2    1 0        Abortion Past Medical History  Diagnosis Date  . Urinary tract infection   . Cellulitis   . Chlamydia   . 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 Infection     UTI X 1  . Infection 2009    CHLAMYDIA  . Infection     HX OF FREQUENT YEAST  . Infection 2012    CELLULITIS   Past Surgical History  Procedure Date  . Induced abortion   . No past surgeries    Family History: family history includes Diabetes in her maternal grandmother and Hypertension in her cousin, maternal grandmother, and mother.  There is no history of Other. Social History:  reports that she has quit smoking. Her smoking use included Cigarettes. She has never used smokeless tobacco. She reports that she does not drink alcohol or use illicit drugs.   Prenatal Transfer Tool  Maternal Diabetes: No Genetic Screening: Normal Maternal Ultrasounds/Referrals: Normal [place] with anterior placenta Fetal Ultrasounds or other Referrals:  None Maternal Substance Abuse:  Not assessed Significant Maternal Medications:  None Significant Maternal Lab Results:  Lab values include: Group B Strep negative Other Comments:  PIH labs pending  ROS  Dilation: 5 Effacement (%): 100 Station: -1 Exam by:: Lavera Guise CNM Blood pressure 129/77, pulse 65, temperature 97.4 F (36.3 C), temperature source Oral, resp. rate 20,  height 5\' 5"  (1.651 m), weight 243 lb (110.224 kg), last menstrual period 01/11/2012. Exam Physical Exam  Tense deep breathing with uc, cooperative, HEENT WNL grossly,  lungs clear bilaterally, AP RRR,  abd soft nt,no masses, not tympanic bowel sounds active,  trace edema to lower extremities DTRS bilaterally + 1 no clonus EFW 7#12 Vertex intact membranes Prenatal labs: ABO, Rh: O/POS/-- (07/17 1004) Antibody: NEG (07/17 1004) Rubella: 13.2 (07/17 1004) RPR: NON REAC (07/17 1004)  HBsAg: NEGATIVE (07/17 1004)  HIV: NON REACTIVE (07/17 1004)  GBS: Negative (12/17 0000)   Assessment/Plan: [redacted]w[redacted]d GBS + Routine admission, epidural per pt request, PIH labs pending, collaboration with Dr. Su Hilt.   Annette Jones 10/17/2012, 12:07 AM

## 2012-10-17 NOTE — Anesthesia Postprocedure Evaluation (Signed)
  Anesthesia Post-op Note  Patient: Annette Jones  Procedure(s) Performed: * No procedures listed *  Patient Location: Mother/Baby  Anesthesia Type:Epidural  Level of Consciousness: awake  Airway and Oxygen Therapy: Patient Spontanous Breathing  Post-op Pain: mild  Post-op Assessment: Patient's Cardiovascular Status Stable and Respiratory Function Stable  Post-op Vital Signs: stable  Complications: No apparent anesthesia complications

## 2012-10-17 NOTE — Progress Notes (Addendum)
0350 Severe variables C +2 called Dr. Su Hilt and coming now will plan not to push until MD in house, repositioned to semifowlers then to R side. Lavera Guise, CNM  Pt delivered a viable baby doing well.  I was asked to assess laceration and there was a shallow rt sulcus tear extending approx 4cm up vaginal wall that I repaired with 2-0 vicryl.  4-0 monocryl was also used for the remainder of the tear near the introitus.  No perineal laceration noted.  Pt tolerated repair well.

## 2012-10-17 NOTE — Anesthesia Procedure Notes (Signed)

## 2012-10-17 NOTE — Progress Notes (Signed)
UR chart review completed.  

## 2012-10-17 NOTE — Anesthesia Preprocedure Evaluation (Signed)
Anesthesia Evaluation  Patient identified by MRN, date of birth, ID band Patient awake    Reviewed: Allergy & Precautions, H&P , Patient's Chart, lab work & pertinent test results  Airway Mallampati: III  TM Distance: >3 FB Neck ROM: full    Dental  (+) Teeth Intact   Pulmonary  breath sounds clear to auscultation        Cardiovascular Rhythm:regular Rate:Normal     Neuro/Psych    GI/Hepatic   Endo/Other  Morbid obesity  Renal/GU      Musculoskeletal   Abdominal   Peds  Hematology   Anesthesia Other Findings       Reproductive/Obstetrics (+) Pregnancy                             Anesthesia Physical Anesthesia Plan  ASA: III  Anesthesia Plan: Epidural   Post-op Pain Management:    Induction:   Airway Management Planned:   Additional Equipment:   Intra-op Plan:   Post-operative Plan:   Informed Consent: I have reviewed the patients History and Physical, chart, labs and discussed the procedure including the risks, benefits and alternatives for the proposed anesthesia with the patient or authorized representative who has indicated his/her understanding and acceptance.   Dental Advisory Given  Plan Discussed with:   Anesthesia Plan Comments: (Labs checked- platelets confirmed with RN in room. Fetal heart tracing, per RN, reported to be stable enough for sitting procedure. Discussed epidural, and patient consents to the procedure:  included risk of possible headache,backache, failed block, allergic reaction, and nerve injury. This patient was asked if she had any questions or concerns before the procedure started.)        Anesthesia Quick Evaluation  

## 2012-10-18 ENCOUNTER — Encounter (HOSPITAL_COMMUNITY): Payer: Self-pay | Admitting: Obstetrics and Gynecology

## 2012-10-18 LAB — CBC
Hemoglobin: 10.3 g/dL — ABNORMAL LOW (ref 12.0–15.0)
MCHC: 33 g/dL (ref 30.0–36.0)
RDW: 13.7 % (ref 11.5–15.5)

## 2012-10-18 NOTE — Progress Notes (Signed)
Patient ID: Annette Jones, female   DOB: 1985-12-02, 27 y.o.   MRN: [id number] Post Partum Day1 Subjective: no complaints, voiding and tolerating PO and breast feeding  Objective: Afebrile VSS  Physical Exam:  General: alert Lochia: appropriate Uterine Fundus: firm  DVT Evaluation: No evidence of DVT seen on physical exam.   Basename 10/18/12 0455 10/16/12 2359  HGB 10.3* 11.6*  HCT 31.2* 34.0*    Assessment/Plan: PPD 1 Ptbreast and bottle feeding Routine care   LOS: 2 days   Annette Jones A 10/18/2012, 2:00 PM

## 2012-10-19 ENCOUNTER — Encounter: Payer: Medicaid Other | Admitting: Obstetrics and Gynecology

## 2012-10-19 MED ORDER — HYDROCODONE-ACETAMINOPHEN 5-500 MG PO TABS: 1.0000 | ORAL_TABLET | Freq: Four times a day (QID) | ORAL | Status: DC | PRN

## 2012-10-19 MED ORDER — PRENATAL MULTIVITAMIN CH: 1.0000 | ORAL_TABLET | Freq: Every day | ORAL | Status: DC

## 2012-10-19 MED ORDER — IBUPROFEN 600 MG PO TABS
600.0000 mg | ORAL_TABLET | Freq: Four times a day (QID) | ORAL | Status: DC | PRN
Start: 2012-10-19 — End: 2014-07-20

## 2012-10-19 NOTE — Discharge Summary (Signed)
Obstetric Discharge Summary Reason for Admission: onset of labor Prenatal Procedures: ultrasound Intrapartum Procedures: spontaneous vaginal delivery Postpartum Procedures: none Complications-Operative and Postpartum: none no complaints except a little urgency Hemoglobin  Date Value Range Status  10/18/2012 10.3* 12.0 - 15.0 g/dL Final     HCT  Date Value Range Status  10/18/2012 31.2* 36.0 - 46.0 % Final    Physical Exam:  General: alert and no distress Lochia: appropriate Uterine Fundus: firm DVT Evaluation: No evidence of DVT seen on physical exam.  Discharge Diagnoses: Term Pregnancy-delivered  Discharge Information: Date: 10/19/2012 Activity: pelvic rest Diet: routine Medications: PNV, Ibuprofen and Vicodin Plans OCPs at [facility] Visit (attempting to BF) Condition: stable Instructions: refer to practice specific booklet Discharge to: home Cont to monitor urgency.  If not improving call office for visit to check for UTI.  Newborn Data: Live born female  Birth Weight: 5 lb 3.4 oz (2364 g) APGAR: 9, 9  Home with mother.  Annette Jones Y 10/19/2012, 9:06 AM

## 2012-10-20 ENCOUNTER — Telehealth: Payer: Self-pay | Admitting: Obstetrics and Gynecology

## 2012-10-20 ENCOUNTER — Other Ambulatory Visit: Payer: Self-pay | Admitting: Obstetrics and Gynecology

## 2012-10-20 MED ORDER — HYDROCODONE-ACETAMINOPHEN 5-325 MG PO TABS: 1.0000 | ORAL_TABLET | Freq: Four times a day (QID) | ORAL | Status: DC | PRN

## 2012-10-20 NOTE — Telephone Encounter (Signed)
Tc to [facility] pharmacy regarding msg from the pharmacist Tisha.  Vicodin 5-500 mg was rx'd after pt was d/c'd from [facility] for delivery, they no longer make that dosage, recommended 5-325 mg.  Consulted w/ AVS, per AVS ok to rx, pt's rx called to [facility] and left on vm.

## 2012-10-25 ENCOUNTER — Encounter: Payer: Self-pay | Admitting: Obstetrics and Gynecology

## 2012-10-25 ENCOUNTER — Telehealth: Payer: Self-pay | Admitting: Obstetrics and Gynecology

## 2012-10-25 ENCOUNTER — Ambulatory Visit: Payer: Medicaid Other | Admitting: Obstetrics and Gynecology

## 2012-10-25 VITALS — BP 120/82 | Wt 218.0 lb

## 2012-10-25 DIAGNOSIS — S3141XA Laceration without foreign body of vagina and vulva, initial encounter: Secondary | ICD-10-CM

## 2012-10-25 DIAGNOSIS — O8620 Urinary tract infection following delivery, unspecified: Secondary | ICD-10-CM

## 2012-10-25 DIAGNOSIS — R300 Dysuria: Secondary | ICD-10-CM

## 2012-10-25 LAB — POCT URINALYSIS DIPSTICK
Ketones, UA: 2
Protein, UA: 3
Urobilinogen, UA: NEGATIVE

## 2012-10-25 MED ORDER — PHENAZOPYRIDINE HCL 200 MG PO TABS: 200.0000 mg | ORAL_TABLET | Freq: Three times a day (TID) | ORAL | Status: DC

## 2012-10-25 MED ORDER — CEPHALEXIN 500 MG PO CAPS: 500.0000 mg | ORAL_CAPSULE | Freq: Two times a day (BID) | ORAL | Status: AC

## 2012-10-25 MED ORDER — HYDROCODONE-ACETAMINOPHEN 5-325 MG PO TABS: 1.0000 | ORAL_TABLET | ORAL | Status: DC | PRN

## 2012-10-25 NOTE — Telephone Encounter (Signed)
TC to [place] at [place] in response to message. Ok to prescribe generic Norco for Vocodin. Verified same strength.

## 2012-10-25 NOTE — Progress Notes (Signed)
Here at 8 days s/p SVB c/o dysuria and frequency.   Denies fever or back pain.  Had catheter during labor. Also had repair of a right sulcus tear by Dr. Su Hilt, but no perineal involvement. Had Rx for 5/500 Vicodin, but pharmacy did not fill due to increased acetaminophen amount.  Perineum clear Vaginal laceration healing well, with no sx of infection or erythema.  Sutures present and intact. UA + for UTI Sent to culture  Rxs: Keflex 500 mg po BID x 7 days Pyridium 200 mg po TID x 3 days Vicodin 5/325 1-2 po q 4 hours prn pain, no refills. Will f/u on urine culture--patient to call with any worsening of sx. F/u prn or at 6 week pp visit.  Nigel Bridgeman, CNM

## 2012-10-25 NOTE — Progress Notes (Deleted)
Pt c/o burning while voiding.

## 2012-10-25 NOTE — Telephone Encounter (Signed)
Pt called complaining of UTI symptoms. Pt delivered on 10/17/2012. Pt was given appt with CNM V L at 11:45 am. Mathis Bud

## 2012-10-27 ENCOUNTER — Encounter: Payer: Self-pay | Admitting: Obstetrics and Gynecology

## 2012-10-27 LAB — URINE CULTURE

## 2012-11-14 ENCOUNTER — Telehealth: Payer: Self-pay | Admitting: Obstetrics and Gynecology

## 2012-11-15 ENCOUNTER — Encounter: Payer: Self-pay | Admitting: Obstetrics and Gynecology

## 2012-11-15 ENCOUNTER — Ambulatory Visit: Payer: Medicaid Other | Admitting: Obstetrics and Gynecology

## 2012-11-15 VITALS — BP 120/82 | Temp 98.1°F | Wt 210.0 lb

## 2012-11-15 DIAGNOSIS — R102 Pelvic and perineal pain: Secondary | ICD-10-CM

## 2012-11-15 DIAGNOSIS — R300 Dysuria: Secondary | ICD-10-CM

## 2012-11-15 DIAGNOSIS — N390 Urinary tract infection, site not specified: Secondary | ICD-10-CM

## 2012-11-15 LAB — POCT URINALYSIS DIPSTICK
Ketones, UA: NEGATIVE
Spec Grav, UA: 1.015
pH, UA: 5

## 2012-11-15 MED ORDER — NITROFURANTOIN MONOHYD MACRO 100 MG PO CAPS: 100.0000 mg | ORAL_CAPSULE | Freq: Two times a day (BID) | ORAL | Status: AC

## 2012-11-15 MED ORDER — AMBULATORY NON FORMULARY MEDICATION: 600.0000 mg | Status: DC

## 2012-11-15 NOTE — Progress Notes (Signed)
27 YO S/P SVB 10/17/12 and treated for UTI afterwards with Keflex and Pyridium.  As soon as she finished the medication she became swollen and tender in vaginal area and as if she still has a Foley catheter in.  + dysuria, urgency, incontinence, lower back pain but denies fever, nausea or vomiting.  O: Pelvic: EGBUS  U/A: ph-5,  SG: 1.015 2+ leukocytes Wet Prep: ph-4.5  wjoff-negative no clue, trich, or trich  A: UTI     Shift in vaginal flora     Breastfeeding  P: Boric Acid Capsules 600 mg #30 1 pv twice weekly 11 refills           Macrobid 100 mg #14 bid pc x 7 days      Urine for culture      Perineal hygiene      RTO-as scheduled or prn  Jalan Fariss, PA-C

## 2012-11-15 NOTE — Patient Instructions (Signed)
[place] pharmacies that carry Boric Acid Capsules/Suppositories:  [facility] [address] (only)  [phone]  [facility]  [address] ([facility])  [phone]  [facility] ([place])  [address] [phone]  [facility]  [address] [phone]  [facility] [address] [place], [place] [phone]     

## 2012-11-23 ENCOUNTER — Ambulatory Visit: Payer: Medicaid Other | Admitting: Obstetrics and Gynecology

## 2012-11-23 ENCOUNTER — Encounter: Payer: Self-pay | Admitting: Obstetrics and Gynecology

## 2012-11-23 DIAGNOSIS — R309 Painful micturition, unspecified: Secondary | ICD-10-CM

## 2012-11-23 NOTE — Progress Notes (Signed)
Date of delivery: 10/17/12  Female Name: Lyric Vaginal delivery:yes Cesarean section:no Tubal ligation:no GDM:no Breast Feeding:yes Bottle Feeding:yes Post-Partum Blues:no Abnormal pap:no Normal GU function: yes Normal GI function:yes Returning to work:yes EPDS: 6 BP 120/80  Ht 5\' 5"  (1.651 m)  Wt 214 lb (97.07 kg)  BMI 35.61 kg/m2  LMP 11/16/2012  Breastfeeding? Yes . female who presents for a postpartum visit.  ABD: soft nontender GU: vulva normal no masses seen.  Vagina normal in appearance.  Cervix is parous and NT.  Uterus normal size.  No adnexal tenderness bilaterally or fullness EXT: no CCEB oral contraceptives (estrogen/progesterone) for [facility] May resume intercourse , exercise and normal activity RT 4/14 for AEX

## 2012-11-23 NOTE — Patient Instructions (Signed)
Intrauterine Device Information  An intrauterine device (IUD) is inserted into your uterus and prevents pregnancy. There are 2 types of IUDs available:  · Copper IUD. This type of IUD is wrapped in copper wire and is placed inside the uterus. Copper makes the uterus and fallopian tubes produce a fluid that kills sperm. The copper IUD can stay in place for 10 years.  · Hormone IUD. This type of IUD contains the hormone progestin (synthetic progesterone). The hormone thickens the cervical mucus and prevents sperm from entering the uterus, and it also thins the uterine lining to prevent implantation of a fertilized egg. The hormone can weaken or kill the sperm that get into the uterus. The hormone IUD can stay in place for 5 years.  Your caregiver will make sure you are a good candidate for a contraceptive IUD. Discuss with your caregiver the possible side effects.  ADVANTAGES  · It is highly effective, reversible, long-acting, and low maintenance.  · There are no estrogen-related side effects.  · An IUD can be used when breastfeeding.  · It is not associated with weight gain.  · It works immediately after insertion.  · The copper IUD does not interfere with your female hormones.  · The progesterone IUD can make heavy menstrual periods lighter.  · The progesterone IUD can be used for 5 years.  · The copper IUD can be used for 10 years.  DISADVANTAGES  · The progesterone IUD can be associated with irregular bleeding patterns.  · The copper IUD can make your menstrual flow heavier and more painful.  · You may experience cramping and vaginal bleeding after insertion.  Document Released: 08/18/2004 Document Revised: 12/07/2011 Document Reviewed: 01/17/2011  ExitCare® Patient Information ©2013 [employer].

## 2012-11-23 NOTE — Addendum Note (Signed)
Addended by: Rolla Plate on: 11/23/2012 12:22 PM   Modules accepted: Orders

## 2012-11-24 LAB — URINE CULTURE
Colony Count: NO GROWTH
Organism ID, Bacteria: NO GROWTH

## 2012-12-06 NOTE — Telephone Encounter (Signed)
Note to close enc. Melody Comas A

## 2013-09-20 ENCOUNTER — Emergency Department (INDEPENDENT_AMBULATORY_CARE_PROVIDER_SITE_OTHER)
Admission: EM | Admit: 2013-09-20 | Discharge: 2013-09-20 | Disposition: A | Discharge status: Home / Self Care | Source: Home / Self Care | Attending: Family Medicine | Admitting: Family Medicine

## 2013-09-20 ENCOUNTER — Encounter (HOSPITAL_COMMUNITY): Payer: Self-pay | Admitting: Emergency Medicine

## 2013-09-20 DIAGNOSIS — J069 Acute upper respiratory infection, unspecified: Secondary | ICD-10-CM

## 2013-09-20 MED ORDER — HYDROCODONE-ACETAMINOPHEN 5-325 MG PO TABS: 0.5000 | ORAL_TABLET | Freq: Every evening | ORAL | Status: DC | PRN

## 2013-09-20 MED ORDER — IPRATROPIUM BROMIDE 0.06 % NA SOLN: 2.0000 | Freq: Four times a day (QID) | NASAL | Status: DC

## 2013-09-20 MED ORDER — PREDNISONE 10 MG PO TABS: 30.0000 mg | ORAL_TABLET | Freq: Every day | ORAL | Status: DC

## 2013-09-20 NOTE — ED Notes (Signed)
27 yr old is here with complaints of ear pain - B, HA, cough -dry; yellow, chest congestion x 1 wk

## 2013-09-20 NOTE — ED Provider Notes (Signed)
Annette Jones is a 27 y.o. female who presents to Urgent Care today for ear pain headache cough congestion. This is been present for about one week. Patient denies any nausea vomiting diarrhea shortness of breath. She's tried some over-the-counter medications. She feels well otherwise. Mild subjective fever and chills.   Past Medical History  Diagnosis Date  . Urinary tract infection   . Cellulitis   . Chlamydia   . 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 Infection     UTI X 1  . Infection 2009    CHLAMYDIA  . Infection     HX OF FREQUENT YEAST  . Infection 2012    CELLULITIS  . Elevated hemoglobin A1c 04/14/2012    5.9 at NOB interview--plan early glucola at 18 weeks.    History  Substance Use Topics  . Smoking status: Former Smoker    Types: Cigarettes  . Smokeless tobacco: Never Used  . Alcohol Use: No     Comment: 2 GLASSES WINE /DAY; LAST DRANK 01/27/2012   ROS as above Medications reviewed. No current facility-administered medications for this encounter.   Current Outpatient Prescriptions  Medication Sig Dispense Refill  . etonogestrel (IMPLANON) 68 MG IMPL implant Inject 1 each into the skin once.      . Prenatal Vit-Fe Fumarate-FA (PRENATAL MULTIVITAMIN) TABS Take 1 tablet by mouth daily.  30 tablet  11  . acetaminophen (TYLENOL) 325 MG tablet Take 325 mg by mouth at bedtime as needed. For tooth pain      . AMBULATORY NON FORMULARY MEDICATION Place 600 mg vaginally 2 (two) times a week. Medication Name: Boric Acid 600 mg  1 pv twice a week  30 suppository  11  . HYDROcodone-acetaminophen (NORCO/VICODIN) 5-325 MG per tablet Take 0.5 tablets by mouth at bedtime as needed (cough).  6 tablet  0  . ibuprofen (ADVIL,MOTRIN) 600 MG tablet Take 1 tablet (600 mg total) by mouth every 6 (six) hours as needed for pain.  30 tablet  1  . ipratropium (ATROVENT) 0.06 % nasal spray Place 2 sprays into both nostrils 4 (four) times daily.  15 mL  1  . predniSONE (DELTASONE) 10 MG  tablet Take 3 tablets (30 mg total) by mouth daily.  15 tablet  0    Exam:  BP 163/98  Pulse 93  Temp(Src) 99.7 F (37.6 C) (Oral)  Resp 16  SpO2 100%  Breastfeeding? No Gen: Well NAD HEENT: EOMI,  MMM posterior pharynx with cobblestoning. Tympanic membranes normal appearing bilaterally. Lungs: Normal work of breathing. CTABL Heart: RRR no MRG Abd: NABS, Soft. NT, ND Exts: Non edematous BL  LE, warm and well perfused.    Assessment and Plan: 27 y.o. female with viral URI with cough. Plan for symptomatic management with low dose short duration prednisone, Atrovent nasal spray, Norco for cough at night.  Discussed warning signs or symptoms. Please see discharge instructions. Patient expresses understanding.      Rodolph Bong, MD 09/20/13 [phone]

## 2014-07-20 ENCOUNTER — Emergency Department (HOSPITAL_COMMUNITY)

## 2014-07-20 ENCOUNTER — Encounter (HOSPITAL_COMMUNITY): Payer: Self-pay | Admitting: Emergency Medicine

## 2014-07-20 ENCOUNTER — Emergency Department (HOSPITAL_COMMUNITY)
Admission: EM | Admit: 2014-07-20 | Discharge: 2014-07-20 | Disposition: A | Discharge status: Home / Self Care | Attending: Emergency Medicine | Admitting: Emergency Medicine

## 2014-07-20 DIAGNOSIS — M79609 Pain in unspecified limb: Secondary | ICD-10-CM

## 2014-07-20 DIAGNOSIS — Z872 Personal history of diseases of the skin and subcutaneous tissue: Secondary | ICD-10-CM | POA: Diagnosis not present

## 2014-07-20 DIAGNOSIS — Z3202 Encounter for pregnancy test, result negative: Secondary | ICD-10-CM | POA: Insufficient documentation

## 2014-07-20 DIAGNOSIS — R000 Tachycardia, unspecified: Secondary | ICD-10-CM | POA: Diagnosis not present

## 2014-07-20 DIAGNOSIS — R52 Pain, unspecified: Secondary | ICD-10-CM

## 2014-07-20 DIAGNOSIS — N390 Urinary tract infection, site not specified: Secondary | ICD-10-CM | POA: Insufficient documentation

## 2014-07-20 DIAGNOSIS — R509 Fever, unspecified: Secondary | ICD-10-CM

## 2014-07-20 DIAGNOSIS — E119 Type 2 diabetes mellitus without complications: Secondary | ICD-10-CM | POA: Diagnosis not present

## 2014-07-20 DIAGNOSIS — M79651 Pain in right thigh: Secondary | ICD-10-CM | POA: Insufficient documentation

## 2014-07-20 DIAGNOSIS — Z8619 Personal history of other infectious and parasitic diseases: Secondary | ICD-10-CM | POA: Insufficient documentation

## 2014-07-20 DIAGNOSIS — Z87891 Personal history of nicotine dependence: Secondary | ICD-10-CM | POA: Diagnosis not present

## 2014-07-20 LAB — URINALYSIS, ROUTINE W REFLEX MICROSCOPIC
Bilirubin Urine: NEGATIVE
Glucose, UA: NEGATIVE mg/dL
Ketones, ur: 15 mg/dL — AB
NITRITE: NEGATIVE
PROTEIN: NEGATIVE mg/dL
Specific Gravity, Urine: 1.017 (ref 1.005–1.030)
UROBILINOGEN UA: 1 mg/dL (ref 0.0–1.0)
pH: 7 (ref 5.0–8.0)

## 2014-07-20 LAB — CBC WITH DIFFERENTIAL/PLATELET
BASOS PCT: 0 % (ref 0–1)
Basophils Absolute: 0 10*3/uL (ref 0.0–0.1)
EOS ABS: 0 10*3/uL (ref 0.0–0.7)
EOS PCT: 0 % (ref 0–5)
HCT: 36 % (ref 36.0–46.0)
Hemoglobin: 12 g/dL (ref 12.0–15.0)
LYMPHS ABS: 0.9 10*3/uL (ref 0.7–4.0)
Lymphocytes Relative: 6 % — ABNORMAL LOW (ref 12–46)
MCH: 27.3 pg (ref 26.0–34.0)
MCHC: 33.3 g/dL (ref 30.0–36.0)
MCV: 81.8 fL (ref 78.0–100.0)
Monocytes Absolute: 0.9 10*3/uL (ref 0.1–1.0)
Monocytes Relative: 7 % (ref 3–12)
Neutro Abs: 11.8 10*3/uL — ABNORMAL HIGH (ref 1.7–7.7)
Neutrophils Relative %: 87 % — ABNORMAL HIGH (ref 43–77)
PLATELETS: 275 10*3/uL (ref 150–400)
RBC: 4.4 MIL/uL (ref 3.87–5.11)
RDW: 12.4 % (ref 11.5–15.5)
WBC: 13.6 10*3/uL — ABNORMAL HIGH (ref 4.0–10.5)

## 2014-07-20 LAB — POC URINE PREG, ED: Preg Test, Ur: NEGATIVE

## 2014-07-20 LAB — COMPREHENSIVE METABOLIC PANEL
ALBUMIN: 4.1 g/dL (ref 3.5–5.2)
ALT: 14 U/L (ref 0–35)
ANION GAP: 16 — AB (ref 5–15)
AST: 27 U/L (ref 0–37)
Alkaline Phosphatase: 72 U/L (ref 39–117)
BUN: 10 mg/dL (ref 6–23)
CALCIUM: 9 mg/dL (ref 8.4–10.5)
CO2: 21 mEq/L (ref 19–32)
Chloride: 101 mEq/L (ref 96–112)
Creatinine, Ser: 0.99 mg/dL (ref 0.50–1.10)
GFR calc non Af Amer: 76 mL/min — ABNORMAL LOW (ref >90)
GFR, EST AFRICAN AMERICAN: 88 mL/min — AB (ref >90)
GLUCOSE: 86 mg/dL (ref 70–99)
Potassium: 4.3 mEq/L (ref 3.7–5.3)
SODIUM: 138 meq/L (ref 137–147)
TOTAL PROTEIN: 8.2 g/dL (ref 6.0–8.3)
Total Bilirubin: 0.6 mg/dL (ref 0.3–1.2)

## 2014-07-20 LAB — URINE MICROSCOPIC-ADD ON

## 2014-07-20 LAB — CK: CK TOTAL: 262 U/L — AB (ref 7–177)

## 2014-07-20 LAB — CBG MONITORING, ED: Glucose-Capillary: 65 mg/dL — ABNORMAL LOW (ref 70–99)

## 2014-07-20 LAB — I-STAT CG4 LACTIC ACID, ED
LACTIC ACID, VENOUS: 0.75 mmol/L (ref 0.5–2.2)
Lactic Acid, Venous: 1.01 mmol/L (ref 0.5–2.2)

## 2014-07-20 MED ORDER — SODIUM CHLORIDE 0.9 % IV BOLUS (SEPSIS)
1000.0000 mL | INTRAVENOUS | Status: AC
  Administered 2014-07-20 (×2): 1000 mL via INTRAVENOUS

## 2014-07-20 MED ORDER — OXYCODONE-ACETAMINOPHEN 5-325 MG PO TABS: 2.0000 | ORAL_TABLET | ORAL | Status: DC | PRN

## 2014-07-20 MED ORDER — FENTANYL CITRATE 0.05 MG/ML IJ SOLN
100.0000 ug | Freq: Once | INTRAMUSCULAR | Status: AC
  Administered 2014-07-20: 100 ug via INTRAVENOUS
  Filled 2014-07-20: qty 2

## 2014-07-20 MED ORDER — IOHEXOL 300 MG/ML  SOLN
80.0000 mL | Freq: Once | INTRAMUSCULAR | Status: AC | PRN
  Administered 2014-07-20: 80 mL via INTRAVENOUS

## 2014-07-20 MED ORDER — CEPHALEXIN 500 MG PO CAPS: ORAL_CAPSULE | ORAL | Status: DC

## 2014-07-20 NOTE — ED Notes (Signed)
C/o pain in right leg, states her leg started hurting after dancing last pm, c/o pain in her knee and up into her groin area. Denies injury.

## 2014-07-20 NOTE — Progress Notes (Signed)
*  Preliminary Results* Right lower extremity venous duplex completed. Right lower extremity is negative for deep vein thrombosis. There is no evidence of right Baker's cyst.  07/20/2014 10:51 AM  Gertie FeyMichelle Delaila Nand, RVT, RDCS, RDMS

## 2014-07-20 NOTE — ED Notes (Signed)
Woke this am with right thigh pain. States "it feels like cellulitis". Pain right thigh and right knee. No redness noted. Pt has hx of DM (borderline). Has open wound under right middle toe. No redness noted to right foot.

## 2014-07-20 NOTE — ED Notes (Signed)
Patient requested pain medicine.  EDP notified.

## 2014-07-20 NOTE — ED Provider Notes (Signed)
CSN: [id number]     Arrival date & time 07/20/14  0900 History   First MD Initiated Contact with Patient 07/20/14 [phone]     No chief complaint on file.  fever and right leg pain   (Consider location/radiation/quality/duration/timing/severity/associated sxs/prior Treatment) HPI 28 year old female with history of borderline diabetes ran out of her diabetic pills does not check blood sugars does not have a family doctor presents with several hours of fever chills and right thigh pain medially with no trauma no redness no swelling no weakness no numbness no other concerns with no treatment prior to arrival. The pain is constant aching severe worse with palpation and movement. There's no confusion no rash no chest pain no cough no shortness of breath no abdominal pain no vomiting no diarrhea no dysuria no vaginal bleeding no vaginal discharge. Past Medical History  Diagnosis Date  . Urinary tract infection   . Cellulitis   . Chlamydia   . 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 Infection     UTI X 1  . Infection 2009    CHLAMYDIA  . Infection     HX OF FREQUENT YEAST  . Infection 2012    CELLULITIS  . Elevated hemoglobin A1c 04/14/2012    5.9 at NOB interview--plan early glucola at 18 weeks.    Past Surgical History  Procedure Laterality Date  . Induced abortion    . No past surgeries     Family History  Problem Relation Age of Onset  . Other Neg Hx   . Hypertension Mother   . Diabetes Maternal Grandmother   . Hypertension Maternal Grandmother   . Hypertension Cousin    History  Substance Use Topics  . Smoking status: Former Smoker    Types: Cigarettes  . Smokeless tobacco: Never Used  . Alcohol Use: 3.5 oz/week    7 drink(s) per week     Comment: 2 GLASSES WINE /DAY; LAST DRANK 01/27/2012   OB History    Gravida Para Term Preterm AB TAB SAB Ectopic Multiple Living   2 1 1  1  0    1     Review of Systems 10 Systems reviewed and are negative for acute change except as  noted in the HPI.   Allergies  Review of patient's allergies indicates no known allergies.  Home Medications   Prior to Admission medications   Medication Sig Start Date End Date Taking? Authorizing Provider  etonogestrel (IMPLANON) 68 MG IMPL implant Inject 1 each into the skin once.   Yes Historical Provider, MD  cephALEXin (KEFLEX) 500 MG capsule 2 caps po bid x 7 days 07/20/14   Hurman HornJohn M Kadir Azucena, MD  oxyCODONE-acetaminophen (PERCOCET) 5-325 MG per tablet Take 2 tablets by mouth every 4 (four) hours as needed. 07/20/14   Hurman HornJohn M Yoselyn Mcglade, MD  sulfamethoxazole-trimethoprim (BACTRIM DS) 800-160 MG per tablet Take 1 tablet by mouth 2 (two) times daily. 07/22/14   Elwin MochaBlair Walden, MD   BP 139/97 mmHg  Pulse 108  Temp(Src) 100.8 F (38.2 C) (Oral)  Resp 18  Wt 237 lb 7 oz (107.7 kg)  SpO2 100%  LMP 07/06/2014 Physical Exam  Nursing note and vitals reviewed. Constitutional:  Awake, alert, nontoxic appearance.  HENT:  Head: Atraumatic.  Eyes: Right eye exhibits no discharge. Left eye exhibits no discharge.  Neck: Neck supple.  Cardiovascular: Regular rhythm.   No murmur heard. Tachycardic  Pulmonary/Chest: Effort normal and breath sounds normal. No respiratory distress. She has no wheezes.  She has no rales. She exhibits no tenderness.  Abdominal: Soft. Bowel sounds are normal. She exhibits no distension. There is no tenderness. There is no rebound and no guarding.  Musculoskeletal: She exhibits tenderness. She exhibits no edema.  Baseline ROM, no obvious new focal weakness. Right medial thigh tender without swelling or color change or palpable mass or subcutaneous emphysema with no tenderness to right hip knee lower leg ankle or foot. Right foot dorsalis pedis pulse intact with capillary refill less than 2 seconds normal light touch good movement of right foot and right leg with right fourth toe plantar aspect superficial partial-thickness 1cm splitting of skin likely secondary to surrounding  tinea pedis with no erythema no foul odor no purulent discharge no swelling and localized minimal tenderness only with no tenderness to the rest of the foot  Neurological: She is alert.  Mental status and motor strength appears baseline for patient and situation.  Skin: No rash noted.  Psychiatric: She has a normal mood and affect.    ED Course  Procedures (including critical care time) The patient developed some chills and generalized body aches while in the ED; doubt necrotizing fasciitis or myositis or septic shock; suspect a viral syndrome however with questionable urinalysis will start patient on antibiotics and discharge. Labs Review Labs Reviewed  CBC WITH DIFFERENTIAL - Abnormal; Notable for the following:    WBC 13.6 (*)    Neutrophils Relative % 87 (*)    Neutro Abs 11.8 (*)    Lymphocytes Relative 6 (*)    All other components within normal limits  COMPREHENSIVE METABOLIC PANEL - Abnormal; Notable for the following:    GFR calc non Af Amer 76 (*)    GFR calc Af Amer 88 (*)    Anion gap 16 (*)    All other components within normal limits  URINALYSIS, ROUTINE W REFLEX MICROSCOPIC - Abnormal; Notable for the following:    Hgb urine dipstick MODERATE (*)    Ketones, ur 15 (*)    Leukocytes, UA SMALL (*)    All other components within normal limits  CK - Abnormal; Notable for the following:    Total CK 262 (*)    All other components within normal limits  URINE MICROSCOPIC-ADD ON - Abnormal; Notable for the following:    Squamous Epithelial / LPF FEW (*)    Bacteria, UA FEW (*)    All other components within normal limits  CBG MONITORING, ED - Abnormal; Notable for the following:    Glucose-Capillary 65 (*)    All other components within normal limits  CULTURE, BLOOD (ROUTINE X 2)  CULTURE, BLOOD (ROUTINE X 2)  URINE CULTURE  I-STAT CG4 LACTIC ACID, ED  POC URINE PREG, ED  I-STAT CG4 LACTIC ACID, ED    Imaging Review No results found. No DVT on [place] per Vasc tech.  1050   EKG Interpretation   Date/Time:  Friday July 20 2014 10:19:33 EDT Ventricular Rate:  107 PR Interval:  140 QRS Duration: 82 QT Interval:  320 QTC Calculation: 427 R Axis:   25 Text Interpretation:  Sinus tachycardia Borderline T abnormalities,  anterior leads ED PHYSICIAN INTERPRETATION AVAILABLE IN [facility]  Confirmed by TEST, Record ([id number]) on 07/22/2014 12:27:48 PM     MUSE not working ECG: Sinus tachycardia, ventricular rate 107, normal axis, borderline nonspecific T wave abnormality anterior leads, no comparison ECG available  MDM   Final diagnoses:  Urinary tract infection without hematuria, site unspecified  Patient informed of clinical course, understand medical decision-making process, and agree with plan. I doubt any other EMC precluding discharge at this time including, but not necessarily limited to the following:DVT, nec. fasc.    Hurman HornJohn M Eleonor Ocon, MD 07/29/14 1726

## 2014-07-20 NOTE — Discharge Instructions (Signed)
A fever means your temperature is over 100.4 F (37.8C). In adults this is most often due to viral or bacterial infections. If a fever lasts for over 2 or 3 days and does not get better with medicine, further medical examination is needed to find the cause. ° °SEEK MEDICAL CARE IF: °Your temperature stays around 104 F (40 C) or if you have repeated vomiting, unable to take fluids, breathing problems, a severe headache, confusion, a new rash, abdominal pain, difficulty urinating, become poorly responsive, or any new symptoms. ° °

## 2014-07-20 NOTE — ED Notes (Signed)
Patient taken to vascular lab.

## 2014-07-21 LAB — URINE CULTURE: Colony Count: 25000

## 2014-07-22 ENCOUNTER — Emergency Department (HOSPITAL_COMMUNITY)
Admission: EM | Admit: 2014-07-22 | Discharge: 2014-07-22 | Disposition: A | Discharge status: Home / Self Care | Attending: Emergency Medicine | Admitting: Emergency Medicine

## 2014-07-22 ENCOUNTER — Encounter (HOSPITAL_COMMUNITY): Payer: Self-pay | Admitting: Emergency Medicine

## 2014-07-22 DIAGNOSIS — Z87440 Personal history of urinary (tract) infections: Secondary | ICD-10-CM | POA: Diagnosis not present

## 2014-07-22 DIAGNOSIS — Z793 Long term (current) use of hormonal contraceptives: Secondary | ICD-10-CM | POA: Diagnosis not present

## 2014-07-22 DIAGNOSIS — Z87891 Personal history of nicotine dependence: Secondary | ICD-10-CM | POA: Diagnosis not present

## 2014-07-22 DIAGNOSIS — Z8619 Personal history of other infectious and parasitic diseases: Secondary | ICD-10-CM | POA: Diagnosis not present

## 2014-07-22 DIAGNOSIS — L03115 Cellulitis of right lower limb: Secondary | ICD-10-CM

## 2014-07-22 DIAGNOSIS — M79661 Pain in right lower leg: Secondary | ICD-10-CM | POA: Diagnosis present

## 2014-07-22 DIAGNOSIS — Z8639 Personal history of other endocrine, nutritional and metabolic disease: Secondary | ICD-10-CM | POA: Diagnosis not present

## 2014-07-22 LAB — CBC WITH DIFFERENTIAL/PLATELET
BASOS PCT: 0 % (ref 0–1)
Basophils Absolute: 0 10*3/uL (ref 0.0–0.1)
EOS PCT: 0 % (ref 0–5)
Eosinophils Absolute: 0 10*3/uL (ref 0.0–0.7)
HEMATOCRIT: 34.6 % — AB (ref 36.0–46.0)
Hemoglobin: 11.5 g/dL — ABNORMAL LOW (ref 12.0–15.0)
Lymphocytes Relative: 21 % (ref 12–46)
Lymphs Abs: 1.8 10*3/uL (ref 0.7–4.0)
MCH: 27.3 pg (ref 26.0–34.0)
MCHC: 33.2 g/dL (ref 30.0–36.0)
MCV: 82 fL (ref 78.0–100.0)
MONO ABS: 0.9 10*3/uL (ref 0.1–1.0)
Monocytes Relative: 10 % (ref 3–12)
Neutro Abs: 5.8 10*3/uL (ref 1.7–7.7)
Neutrophils Relative %: 69 % (ref 43–77)
Platelets: 235 10*3/uL (ref 150–400)
RBC: 4.22 MIL/uL (ref 3.87–5.11)
RDW: 12.7 % (ref 11.5–15.5)
WBC: 8.6 10*3/uL (ref 4.0–10.5)

## 2014-07-22 MED ORDER — SULFAMETHOXAZOLE-TMP DS 800-160 MG PO TABS
1.0000 | ORAL_TABLET | Freq: Once | ORAL | Status: AC
  Administered 2014-07-22: 1 via ORAL
  Filled 2014-07-22: qty 1

## 2014-07-22 MED ORDER — SULFAMETHOXAZOLE-TMP DS 800-160 MG PO TABS: 1.0000 | ORAL_TABLET | Freq: Two times a day (BID) | ORAL | Status: DC

## 2014-07-22 NOTE — ED Provider Notes (Signed)
CSN: [id number]     Arrival date & time 07/22/14  1934 History   First MD Initiated Contact with Patient 07/22/14 2259     Chief Complaint  Patient presents with  . Leg Pain     (Consider location/radiation/quality/duration/timing/severity/associated sxs/prior Treatment) Patient is a 28 y.o. female presenting with leg pain. The history is provided by the patient.  Leg Pain Location:  Leg Time since incident:  2 days Injury: no   Leg location:  R leg and R lower leg Pain details:    Quality:  Aching   Radiates to:  R leg   Severity:  Moderate   Onset quality:  Gradual   Timing:  Constant   Progression:  Worsening Chronicity:  Recurrent Associated symptoms: no fever     Past Medical History  Diagnosis Date  . Urinary tract infection   . Cellulitis   . Chlamydia   . 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 Infection     UTI X 1  . Infection 2009    CHLAMYDIA  . Infection     HX OF FREQUENT YEAST  . Infection 2012    CELLULITIS  . Elevated hemoglobin A1c 04/14/2012    5.9 at NOB interview--plan early glucola at 18 weeks.    Past Surgical History  Procedure Laterality Date  . Induced abortion    . No past surgeries     Family History  Problem Relation Age of Onset  . Other Neg Hx   . Hypertension Mother   . Diabetes Maternal Grandmother   . Hypertension Maternal Grandmother   . Hypertension Cousin    History  Substance Use Topics  . Smoking status: Former Smoker    Types: Cigarettes  . Smokeless tobacco: Never Used  . Alcohol Use: 3.5 oz/week    7 drink(s) per week     Comment: 2 GLASSES WINE /DAY; LAST DRANK 01/27/2012   OB History   Grav Para Term Preterm Abortions TAB SAB Ect Mult Living   2 1 1  1  0    1     Review of Systems  Constitutional: Negative for fever and chills.  Respiratory: Negative for cough and shortness of breath.   Gastrointestinal: Negative for vomiting and abdominal pain.  All other systems reviewed and are  negative.     Allergies  Review of patient's allergies indicates no known allergies.  Home Medications   Prior to Admission medications   Medication Sig Start Date End Date Taking? Authorizing Provider  cephALEXin (KEFLEX) 500 MG capsule 2 caps po bid x 7 days 07/20/14   Hurman HornJohn M Bednar, MD  etonogestrel (IMPLANON) 68 MG IMPL implant Inject 1 each into the skin once.    Historical Provider, MD  oxyCODONE-acetaminophen (PERCOCET) 5-325 MG per tablet Take 2 tablets by mouth every 4 (four) hours as needed. 07/20/14   Hurman HornJohn M Bednar, MD   BP 134/93  Pulse 86  Temp(Src) 99 F (37.2 C) (Oral)  Resp 18  Ht 5\' 5"  (1.651 m)  Wt 220 lb (99.791 kg)  BMI 36.61 kg/m2  SpO2 100%  LMP 07/06/2014 Physical Exam  Nursing note and vitals reviewed. Constitutional: She is oriented to person, place, and time. She appears well-developed and well-nourished. No distress.  HENT:  Head: Normocephalic and atraumatic.  Mouth/Throat: Oropharynx is clear and moist.  Eyes: EOM are normal. Pupils are equal, round, and reactive to light.  Neck: Normal range of motion. Neck supple.  Cardiovascular: Normal rate and  regular rhythm.  Exam reveals no friction rub.   No murmur heard. Pulmonary/Chest: Effort normal and breath sounds normal. No respiratory distress. She has no wheezes. She has no rales.  Abdominal: Soft. She exhibits no distension. There is no tenderness. There is no rebound.  Musculoskeletal: Normal range of motion. She exhibits no edema.  Neurological: She is alert and oriented to person, place, and time. No cranial nerve deficit. She exhibits normal muscle tone. Coordination normal.  Skin: No rash noted. She is not diaphoretic. There is erythema (R anterior distal shin, extending medially and superiorly. No circumferential erythema).    ED Course  Procedures (including critical care time) Labs Review Labs Reviewed  CBC WITH DIFFERENTIAL - Abnormal; Notable for the following:    Hemoglobin 11.5  (*)    HCT 34.6 (*)    All other components within normal limits    Imaging Review No results found.   EKG Interpretation None      MDM   Final diagnoses:  Cellulitis of right leg    27F here with R leg pain, redness. Hx of cellulitis, feels like prior. Seen 2 days ago, given antibiotics for a UTI. Had a RLE US which was negative for DVT. She is on keflex for her UTI. On exam, AFVSS. R leg with mild swelling, redness. No circumferential redness. With negative US 2 days ago, and only progressive redness, will add bactrim for MRSA coverage. Discharged.     Elwin MochaBlair Kenzie Thoreson, MD 07/22/14 [phone]

## 2014-07-22 NOTE — ED Notes (Signed)
Rt leg pain and swelling since Friday.  She was seen and given antibiotics for a uti.  Now the pain has increased and the rt leg s swollen and red from the  Mid tib fib.  lmp  2 weeks ago

## 2014-07-22 NOTE — ED Notes (Signed)
Pt discharged home with all belongings, pt escorted to exit via wheel chair by Arther DamesLeslie RN, 1 new RX prescribed, pt verbalizes understanding of discharge instructions, pt driven home by mother, pt alert and oriented x4

## 2014-07-22 NOTE — Discharge Instructions (Signed)
Cellulitis °Cellulitis is an infection of the skin and the tissue beneath it. The infected area is usually red and tender. Cellulitis occurs most often in the arms and lower legs.  °CAUSES  °Cellulitis is caused by bacteria that enter the skin through cracks or cuts in the skin. The most common types of bacteria that cause cellulitis are staphylococci and streptococci. °SIGNS AND SYMPTOMS  °· Redness and warmth. °· Swelling. °· Tenderness or pain. °· Fever. °DIAGNOSIS  °Your health care provider can usually determine what is wrong based on a physical exam. Blood tests may also be done. °TREATMENT  °Treatment usually involves taking an antibiotic medicine. °HOME CARE INSTRUCTIONS  °· Take your antibiotic medicine as directed by your health care provider. Finish the antibiotic even if you start to feel better. °· Keep the infected arm or leg elevated to reduce swelling. °· Apply a warm cloth to the affected area up to 4 times per day to relieve pain. °· Take medicines only as directed by your health care provider. °· Keep all follow-up visits as directed by your health care provider. °SEEK MEDICAL CARE IF:  °· You notice red streaks coming from the infected area. °· Your red area gets larger or turns dark in color. °· Your bone or joint underneath the infected area becomes painful after the skin has healed. °· Your infection returns in the same area or another area. °· You notice a swollen bump in the infected area. °· You develop new symptoms. °· You have a fever. °SEEK IMMEDIATE MEDICAL CARE IF:  °· You feel very sleepy. °· You develop vomiting or diarrhea. °· You have a general ill feeling (malaise) with muscle aches and pains. °MAKE SURE YOU:  °· Understand these instructions. °· Will watch your condition. °· Will get help right away if you are not doing well or get worse. °Document Released: 06/24/2005 Document Revised: 01/29/2014 Document Reviewed: 11/30/2011 °ExitCare® Patient Information ©2015 [employer].  This information is not intended to replace advice given to you by your health care provider. Make sure you discuss any questions you have with your health care provider. ° ° °Emergency Department Resource Guide °1) Find a Doctor and Pay Out of Pocket °Although you won't have to find out who is covered by your insurance plan, it is a good idea to ask around and get recommendations. You will then need to call the office and see if the doctor you have chosen will accept you as a new patient and what types of options they offer for patients who are self-pay. Some doctors offer discounts or will set up payment plans for their patients who do not have insurance, but you will need to ask so you aren't surprised when you get to your appointment. ° °2) Contact Your Local Health Department °Not all health departments have doctors that can see patients for sick visits, but many do, so it is worth a call to see if yours does. If you don't know where your local health department is, you can check in your phone book. The CDC also has a tool to help you locate your state's health department, and many state websites also have listings of all of their local health departments. ° °3) Find a Walk-in Clinic °If your illness is not likely to be very severe or complicated, you may want to try a walk in clinic. These are popping up all over the country in pharmacies, drugstores, and shopping centers. They're usually staffed by nurse practitioners   or physician assistants that have been trained to treat common illnesses and complaints. They're usually fairly quick and inexpensive. However, if you have serious medical issues or chronic medical problems, these are probably not your best option. ° °No Primary Care Doctor: °- Call [facility] at  832-8000 - they can help you locate a primary care doctor that  accepts your insurance, provides certain services, etc. °- Physician Referral Service- 1-800-533-3463 ° °Chronic Pain  Problems: °Organization         Address  Phone   Notes  °[facility]  (336) 297-2271 Patients need to be referred by their primary care doctor.  ° °Medication Assistance: °Organization         Address  Phone   Notes  °[place] [facility] [address] °[place], [place] [address] (336) 641-8030 --Must be a resident of [place] °-- Must have NO insurance coverage whatsoever (no Medicaid/ Medicare, etc.) °-- The pt. MUST have a primary care doctor that directs their care regularly and follows them in the community °  °[facility]  (866) 331-1348   °[employer]  (888) 892-1162   ° °Agencies that provide inexpensive medical care: °Organization         Address  Phone   Notes  °[facility]  (336) 832-8035   °[facility]    (336) 832-7272   °[facility] [address] °[place], [place] [address] (336) 832-4777   °[facility] [address], °[place] (336) 271-4999   °[facility]    (336) 373-0678   °[facility]    (336) 272-1050   °[facility] ° [address], [place] Phone:  (336) 832-4444, Fax:  (336) 832-4440 Hours of Operation:  9 am - 6 pm, M-F.  Also accepts Medicaid/Medicare and self-pay.  °[facility] ° [address], [place] Phone: (336) 832-3150, Fax: (336) 832-3151. Hours of Operation:  8:30 am - 5:30 pm, M-F.  Also accepts Medicaid and self-pay.  °[facility] [place] [address], [place] Phone: (336) 878-6027   °[facility] [address], [place], [place] (336)723-1848, Ext. 123 Mondays & Thursdays: 7-9 AM.  First 15 patients are seen on a first come, first serve basis. °  ° °Medicaid-accepting [place] Providers: ° °Organization         Address  Phone   Notes  °[facility] [address], [place] (336) 641-2100 Also  accepts self-pay patients.  °[facility] [address], [place] ° (336) 856-9996   °[facility] [address], [place] (336) 288-8857   °[facility] [address] [place] [address], [place] (336) 299-7000   °Veita Bland [address], [place]  ° (336) 373-1557 Only accepts [place] Access Medicaid patients after they have their name applied to their card.  ° °Self-Pay (no insurance) in [place]: ° °Organization         Address  Phone   Notes  °Sickle Cell Patients, [facility] [address],  (336) 832-1970   °[facility] [address],  (336) 832-4400   °[facility] ° [address], [place] (336) 992-4800   °[facility]/Dr. Osei-Bonsu ° [address]   [place] [address], [place] or [address], [place] (336) 841-8500 Phone number for both [place] and [place] locations is the same.  °[facility] [address], [place] (336) 299-0000   °[facility] [address] [place] [address], [place] or [address] (336) 852-7530 °(336) 878-2260   °[facility] [address], [place] (336) 350-1642, phone; (336) 294-5005, fax Sees patients 1st and 3rd Saturday of every month.  Must not qualify for public or private insurance (i.e. Medicaid, Medicare, [place] Health Choice, Veterans' Benefits) • Household income should be no more than 200% of the poverty level •The clinic cannot treat you if you are pregnant or think you are pregnant • Sexually transmitted diseases are not treated at the clinic.  ° ° °Dental Care: °Organization         Address  Phone  Notes  °[facility] [address], [place] (336) 641-6152 Accepts children up to age 21 who are enrolled in Medicaid or [place] Health Choice; pregnant  women with a Medicaid card; and children who have applied for Medicaid or [place] Health Choice, but were declined, whose parents can pay a reduced fee at time of service.  °[facility] [place]  [address], [place] (336) 641-7733 Accepts children up to age 21 who are enrolled in Medicaid or [place] Health Choice; pregnant women with a Medicaid card; and children who have applied for Medicaid or [place] Health Choice, but were declined, whose parents can pay a reduced fee at time of service.  °[facility] ° [address], [place] (336) 641-4533 Patients are seen by appointment only. Walk-ins are not accepted. [facility] will see patients 18 years of age and older. °Monday - Tuesday (8am-5pm) °Most Wednesdays (8:30-5pm) °$30 per visit, cash only  °[facility] ° [address], [place] (336) 641-4533 Patients are seen by appointment only. Walk-ins are not accepted. [facility] will see patients 18 years of age and older. °One Wednesday Evening (Monthly: Volunteer Based).  $30 per visit, cash only  °[facility]  (919) 537-3737 for adults; Children under age 4, call Graduate Pediatric Dentistry at (919) [phone]. Children aged 4-14, please call (919) [phone] to request a pediatric application. ° Dental services are provided in all areas of dental care including fillings, crowns and bridges, complete and partial dentures, implants, gum treatment, root canals, and extractions. Preventive care is also provided. Treatment is provided to both adults and children. °Patients are selected via a lottery and there is often a waiting list. °  °[facility] [address], °[place] ° (336) 763-8833 [email or web address] °  °[facility] [address], [place], [place] (336)723-1848, Ext. 123 Second and Fourth Thursday of each month, opens at 6:30 AM; Clinic ends at 9 AM.  Patients are  seen on a first-come first-served basis, and a limited number are seen during each clinic.  ° °[facility] ° [address], [place], [place] (336) 723-7904   Eligibility Requirements °You must have lived in [place], [place], or [place] counties for at least the last three months. °  You cannot be eligible for state or federal sponsored healthcare insurance, including Veterans Administration, Medicaid, or Medicare. °  You generally cannot be eligible for healthcare insurance through your employer.  °    How to apply: °Eligibility screenings are held every Tuesday and Wednesday afternoon from 1:00 pm until 4:00 pm. You do not need an appointment for the interview!  °[facility] [address], [place], [place] 336-631-2330   °[facility]  336-342-8273   °[facility]  336-703-3100   °[facility]  336-570-6415   ° °Behavioral Health Resources in the Community: °Intensive Outpatient Programs °Organization         Address  Phone  Notes  °[facility] [address], [place], [place] 336-878-6098   °[facility] [address], [place], [place] 336-832-9800   °[facility] [address], [place], [place] ° 336-882-2125   °[facility] [address],  °[place], [place] 1-800-853-5163 or 336-641-4981   °Substance Abuse Resources °Organization         Address  Phone  Notes  °[facility]  336-882-2125   °[facility]  336-784-9470   °[facility]  336-285-9073   °[facility]  336-845-3988   °Residential & Outpatient Substance Abuse Program  1-800-659-3381   °Psychological Services °Organization         Address  Phone  Notes  °[facility]  336- 832-9600   °[facility]  336- 378-7881   °[facility] [address], [place] 1-800-853-5163 or 336-641-4981   ° °Mobile Crisis  Teams °Organization         Address  Phone  Notes  °[facility] Crisis Care Unit  1-877-626-1772   °Assertive °[facility] ° [address] [place], [place] 336-834-9664   °Sharon DeEsch [address] °[place] 336-554-5454   ° °Self-Help/Support Groups °Organization         Address  Phone             Notes  °[facility] - variety of support groups  336- 373-1402 Call for more information  °Narcotics Anonymous (NA), [facility] [address], °[place]  2 meetings at this location  ° °Residential Treatment Programs °Organization         Address  Phone  Notes  °[facility] [address],    °[place]  1-866-801-8205   °[facility] ° [address], [place], [place] 704-293-8524   °[facility] [address], [place] 336-845-3988 Admissions: 8am-3pm M-F  °[facility] [address]    °[place], [place] 336-841-1104   °[facility] [address], [place], [place] 336-379-7146   °[facility] [address]  °[place], [place] 336-285-9073   °[facility] - Intensive Outpatient [address], , [place] 336-852-3033   °[facility] [address]  °[place], [place] 1-877-615-2722 or 336-784-9470   °[facility] [address] [place], [place] 336-227-7417 Accepts Medicaid  °[facility] [address]  ° [place] 1-800-659-3381 Substance Abuse/Addiction Treatment  ° °[place] Behavioral Health Resources °Organization         Address  Phone  Notes  °[facility]  (888) 581-9988   °Julie Brannon, PhD [address] [place], [place]   (336) 349-5553 or (336) 951-0000   °[facility]   [address] °[place], [place] (336) 349-4454   °[facility] [address],   [place], [place] (336) 342-8316  Insurance/Medicaid/sponsorship through [facility]  °[facility] [address]                                    [place], [place] (336) 342-8316 Therapy/tele-psych/case  °[facility] [address]  ° [place], [place] (336) 349-2233    °Dr. Arfeen  (336) 349-4544   °[facility] [place]  [employer] [place] [facility] 1) [address], [place] °2) [address], [place] °3)  [address], [place] (336) 349-3220 °(336) 342-7768 ° °(336) 342-8140   °[place] [facility] (336) 342-1394 or (336) 342-3537 (After Hours)    ° ° ° °

## 2014-07-22 NOTE — ED Notes (Signed)
The pt had lab work drawn on friday

## 2014-07-26 LAB — CULTURE, BLOOD (ROUTINE X 2)
CULTURE: NO GROWTH
Culture: NO GROWTH

## 2014-07-30 ENCOUNTER — Encounter (HOSPITAL_COMMUNITY): Payer: Self-pay | Admitting: Emergency Medicine

## 2014-10-01 ENCOUNTER — Emergency Department (HOSPITAL_COMMUNITY)
Admission: EM | Admit: 2014-10-01 | Discharge: 2014-10-01 | Disposition: A | Discharge status: Home / Self Care | Attending: Emergency Medicine | Admitting: Emergency Medicine

## 2014-10-01 ENCOUNTER — Encounter (HOSPITAL_COMMUNITY): Payer: Self-pay | Admitting: Cardiology

## 2014-10-01 DIAGNOSIS — Z3202 Encounter for pregnancy test, result negative: Secondary | ICD-10-CM | POA: Insufficient documentation

## 2014-10-01 DIAGNOSIS — Z87440 Personal history of urinary (tract) infections: Secondary | ICD-10-CM | POA: Diagnosis not present

## 2014-10-01 DIAGNOSIS — Z792 Long term (current) use of antibiotics: Secondary | ICD-10-CM | POA: Insufficient documentation

## 2014-10-01 DIAGNOSIS — Z8619 Personal history of other infectious and parasitic diseases: Secondary | ICD-10-CM | POA: Insufficient documentation

## 2014-10-01 DIAGNOSIS — Z87891 Personal history of nicotine dependence: Secondary | ICD-10-CM | POA: Insufficient documentation

## 2014-10-01 DIAGNOSIS — L03115 Cellulitis of right lower limb: Secondary | ICD-10-CM | POA: Diagnosis not present

## 2014-10-01 DIAGNOSIS — M79661 Pain in right lower leg: Secondary | ICD-10-CM | POA: Diagnosis present

## 2014-10-01 LAB — PREGNANCY, URINE: PREG TEST UR: NEGATIVE

## 2014-10-01 MED ORDER — CLINDAMYCIN HCL 300 MG PO CAPS: 300.0000 mg | ORAL_CAPSULE | Freq: Four times a day (QID) | ORAL | Status: DC

## 2014-10-01 MED ORDER — CLINDAMYCIN PHOSPHATE 600 MG/50ML IV SOLN
600.0000 mg | Freq: Once | INTRAVENOUS | Status: AC
  Administered 2014-10-01: 600 mg via INTRAVENOUS
  Filled 2014-10-01: qty 50

## 2014-10-01 NOTE — ED Provider Notes (Signed)
CSN: [id number]     Arrival date & time 10/01/14  1001 History   First MD Initiated Contact with Patient 10/01/14 1159     Chief Complaint  Patient presents with  . 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 It began to become painful and red again 2 days ago. She denies any fevers or chills. She denies any injury or trauma.  Patient is a 29 y.o. female presenting with leg pain. The history is provided by the patient.  Leg Pain Lower extremity pain location: Right lower leg. Injury: no   Pain details:    Quality:  Sharp   Radiates to:  Does not radiate   Severity:  Moderate   Duration:  2 days   Timing:  Constant   Progression:  Worsening Chronicity:  Recurrent   Past Medical History  Diagnosis Date  . Urinary tract infection   . Cellulitis   . Chlamydia   . 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 Infection     UTI X 1  . Infection 2009    CHLAMYDIA  . Infection     HX OF FREQUENT YEAST  . Infection 2012    CELLULITIS  . Elevated hemoglobin A1c 04/14/2012    5.9 at [facility] interview--plan early glucola at 18 weeks.    Past Surgical History  Procedure Laterality Date  . Induced abortion    . No past surgeries     Family History  Problem Relation Age of Onset  . Other Neg Hx   . Hypertension Mother   . Diabetes Maternal Grandmother   . Hypertension Maternal Grandmother   . Hypertension Cousin    History  Substance Use Topics  . Smoking status: Former Smoker    Types: Cigarettes  . Smokeless tobacco: Never Used  . Alcohol Use: 3.5 oz/week    7 drink(s) per week     Comment: 2 GLASSES WINE /DAY; LAST DRANK 01/27/2012   OB History    Gravida Para Term Preterm AB TAB SAB Ectopic Multiple Living   0    1     Review of Systems  All other systems reviewed and are  negative.     Allergies  Percocet  Home Medications   Prior to Admission medications   Medication Sig Start Date End Date Taking? Authorizing Provider  etonogestrel (IMPLANON) 68 MG IMPL implant Inject 1 each into the skin once.   Yes Historical Provider, MD  ibuprofen (ADVIL,MOTRIN) 200 MG tablet Take 400 mg by mouth every 6 (six) hours as needed.   Yes Historical Provider, MD  cephALEXin (KEFLEX) 500 MG capsule 2 caps po bid x 7 days Patient not taking: Reported on 10/01/2014 07/20/14   Hurman Horn, MD  oxyCODONE-acetaminophen (PERCOCET) 5-325 MG per tablet Take 2 tablets by mouth every 4 (four) hours as needed. Patient not taking: Reported on 10/01/2014 07/20/14   Hurman Horn, MD  sulfamethoxazole-trimethoprim (BACTRIM DS) 800-160 MG per tablet Take 1 tablet by mouth 2 (two) times daily. Patient not taking: Reported on 10/01/2014 07/22/14   Elwin Mocha, MD   BP 127/91 mmHg  Pulse 75  Temp(Src) 98.4 F (36.9 C) (Oral)  Resp 16  Ht  (1.651 m)  Wt 225 lb (102.059 kg)  BMI 37.44 kg/m2  SpO2 99%  LMP 09/30/2014 Physical Exam  Constitutional: She  is oriented to person, place, and time. She appears well-developed and well-nourished. No distress.  HENT:  Head: Normocephalic and atraumatic.  Neck: Normal range of motion. Neck supple.  Musculoskeletal: Normal range of motion. She exhibits no edema.  There is redness and warmth to the anterior aspect of the right lower leg. This is non-circumferential and distal pulses are easily palpable. There is no calf tenderness and Homans sign is absent.  Neurological: She is alert and oriented to person, place, and time.  Skin: Skin is warm and dry. She is not diaphoretic.  Nursing note and vitals reviewed.   ED Course  Procedures (including critical care time) Labs Review Labs Reviewed  PREGNANCY, URINE    Imaging Review No results found.   EKG Interpretation None      MDM   Final diagnoses:  None    This appears to  be cellulitis. She was treated with an IV dose of clindamycin and will be discharged to home with the same. To return as needed if symptoms do not improve, worsen, or recur.    Geoffery Lyons, MD 10/01/14 1344

## 2014-10-01 NOTE — Discharge Instructions (Signed)
Clindamycin as prescribed.  Return to the emergency department if you develop high fever with vomiting, increased pain or redness, or other new and concerning symptoms.   Cellulitis Cellulitis is an infection of the skin and the tissue beneath it. The infected area is usually red and tender. Cellulitis occurs most often in the arms and lower legs.  CAUSES  Cellulitis is caused by bacteria that enter the skin through cracks or cuts in the skin. The most common types of bacteria that cause cellulitis are staphylococci and streptococci. SIGNS AND SYMPTOMS   Redness and warmth.  Swelling.  Tenderness or pain.  Fever. DIAGNOSIS  Your health care provider can usually determine what is wrong based on a physical exam. Blood tests may also be done. TREATMENT  Treatment usually involves taking an antibiotic medicine. HOME CARE INSTRUCTIONS   Take your antibiotic medicine as directed by your health care provider. Finish the antibiotic even if you start to feel better.  Keep the infected arm or leg elevated to reduce swelling.  Apply a warm cloth to the affected area up to 4 times per day to relieve pain.  Take medicines only as directed by your health care provider.  Keep all follow-up visits as directed by your health care provider. SEEK MEDICAL CARE IF:   You notice red streaks coming from the infected area.  Your red area gets larger or turns dark in color.  Your bone or joint underneath the infected area becomes painful after the skin has healed.  Your infection returns in the same area or another area.  You notice a swollen bump in the infected area.  You develop new symptoms.  You have a fever. SEEK IMMEDIATE MEDICAL CARE IF:   You feel very sleepy.  You develop vomiting or diarrhea.  You have a general ill feeling (malaise) with muscle aches and pains. MAKE SURE YOU:   Understand these instructions.  Will watch your condition.  Will get help right away if you  are not doing well or get worse. Document Released: 06/24/2005 Document Revised: 01/29/2014 Document Reviewed: 11/30/2011 [facility] Patient Information 2015 [place], [place]. This information is not intended to replace advice given to you by your health care provider. Make sure you discuss any questions you have with your health care provider.

## 2014-10-01 NOTE — ED Notes (Signed)
PT reports that she thinks that her cellulitis is back in her right leg. Reports she has been on antibiotics in the past for the same.

## 2015-08-04 ENCOUNTER — Encounter (HOSPITAL_COMMUNITY): Payer: Self-pay | Admitting: *Deleted

## 2015-08-04 ENCOUNTER — Emergency Department (HOSPITAL_COMMUNITY)
Admission: RE | Admit: 2015-08-04 | Discharge: 2015-08-04 | Disposition: A | Discharge status: Home / Self Care | Source: Ambulatory Visit | Attending: Emergency Medicine | Admitting: Emergency Medicine

## 2015-08-04 DIAGNOSIS — Z87891 Personal history of nicotine dependence: Secondary | ICD-10-CM | POA: Insufficient documentation

## 2015-08-04 DIAGNOSIS — Z87440 Personal history of urinary (tract) infections: Secondary | ICD-10-CM | POA: Diagnosis not present

## 2015-08-04 DIAGNOSIS — S0591XA Unspecified injury of right eye and orbit, initial encounter: Secondary | ICD-10-CM | POA: Diagnosis present

## 2015-08-04 DIAGNOSIS — Z872 Personal history of diseases of the skin and subcutaneous tissue: Secondary | ICD-10-CM | POA: Insufficient documentation

## 2015-08-04 DIAGNOSIS — Y9389 Activity, other specified: Secondary | ICD-10-CM | POA: Insufficient documentation

## 2015-08-04 DIAGNOSIS — Z8619 Personal history of other infectious and parasitic diseases: Secondary | ICD-10-CM | POA: Diagnosis not present

## 2015-08-04 DIAGNOSIS — W228XXA Striking against or struck by other objects, initial encounter: Secondary | ICD-10-CM | POA: Insufficient documentation

## 2015-08-04 DIAGNOSIS — Y9289 Other specified places as the place of occurrence of the external cause: Secondary | ICD-10-CM | POA: Insufficient documentation

## 2015-08-04 DIAGNOSIS — S0501XA Injury of conjunctiva and corneal abrasion without foreign body, right eye, initial encounter: Secondary | ICD-10-CM | POA: Insufficient documentation

## 2015-08-04 DIAGNOSIS — Y998 Other external cause status: Secondary | ICD-10-CM | POA: Insufficient documentation

## 2015-08-04 MED ORDER — POLYMYXIN B-TRIMETHOPRIM 10000-0.1 UNIT/ML-% OP SOLN
1.0000 [drp] | OPHTHALMIC | Status: DC
  Administered 2015-08-04: 1 [drp] via OPHTHALMIC
  Filled 2015-08-04: qty 10

## 2015-08-04 MED ORDER — TETRACAINE HCL 0.5 % OP SOLN
1.0000 [drp] | Freq: Once | OPHTHALMIC | Status: AC
  Administered 2015-08-04: 1 [drp] via OPHTHALMIC
  Filled 2015-08-04: qty 2

## 2015-08-04 MED ORDER — FLUORESCEIN SODIUM 1 MG OP STRP
1.0000 | ORAL_STRIP | Freq: Once | OPHTHALMIC | Status: AC
  Administered 2015-08-04: 1 via OPHTHALMIC
  Filled 2015-08-04: qty 1

## 2015-08-04 MED ORDER — IBUPROFEN 800 MG PO TABS
800.0000 mg | ORAL_TABLET | Freq: Once | ORAL | Status: AC
  Administered 2015-08-04: 800 mg via ORAL
  Filled 2015-08-04: qty 1

## 2015-08-04 NOTE — ED Provider Notes (Signed)
CSN: [id number]     Arrival date & time 08/04/15  0149 History   First MD Initiated Contact with Patient 08/04/15 0315     Chief Complaint  Patient presents with  . Eye Injury     (Consider location/radiation/quality/duration/timing/severity/associated sxs/prior Treatment) HPI Comments: Patient reports her daughter accidentally scratched her in the right eye when playing prior to arrival. She reports right eye irritation and watering. No aggravating/alleviating factors.   Patient is a 29 y.o. female presenting with eye injury. The history is provided by the patient.  Eye Injury This is a new problem. The current episode started today. The problem occurs constantly. The problem has been unchanged. Pertinent negatives include no abdominal pain, arthralgias, change in bowel habit, chest pain, fatigue, rash, urinary symptoms or vomiting. Nothing aggravates the symptoms. She has tried nothing for the symptoms. The treatment provided no relief.    Past Medical History  Diagnosis Date  . Urinary tract infection   . Cellulitis   . Chlamydia   . 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 Infection     UTI X 1  . Infection 2009    CHLAMYDIA  . Infection     HX OF FREQUENT YEAST  . Infection 2012    CELLULITIS  . Elevated hemoglobin A1c 04/14/2012    5.9 at [facility] interview--plan early glucola at 18 weeks.    Past Surgical History  Procedure Laterality Date  . Induced abortion    . No past surgeries     Family History  Problem Relation Age of Onset  . Other Neg Hx   . Hypertension Mother   . Diabetes Maternal Grandmother   . Hypertension Maternal Grandmother   . Hypertension Cousin    Social History  Substance Use Topics  . Smoking status: Former Smoker    Types: Cigarettes  . Smokeless tobacco: Never Used  . Alcohol Use: 3.5 oz/week    7 drink(s) per week     Comment: 2 GLASSES WINE /DAY; LAST DRANK 01/27/2012   OB History    Gravida Para Term Preterm AB TAB SAB Ectopic  Multiple Living   2 1 1  1  0    1     Review of Systems  Constitutional: Negative for fatigue.  Eyes: Positive for pain and redness.  Cardiovascular: Negative for chest pain.  Gastrointestinal: Negative for vomiting, abdominal pain and change in bowel habit.  Musculoskeletal: Negative for arthralgias.  Skin: Negative for rash.  All other systems reviewed and are negative.     Allergies  Percocet  Home Medications   Prior to Admission medications   Medication Sig Start Date End Date Taking? Authorizing Provider  cephALEXin (KEFLEX) 500 MG capsule 2 caps po bid x 7 days Patient not taking: Reported on 10/01/2014 07/20/14   Wayland SalinasJohn Bednar, MD  clindamycin (CLEOCIN) 300 MG capsule Take 1 capsule (300 mg total) by mouth 4 (four) times daily. X 7 days 10/01/14   Geoffery Lyonsouglas Delo, MD  etonogestrel (IMPLANON) 68 MG IMPL implant Inject 1 each into the skin once.    Historical Provider, MD  ibuprofen (ADVIL,MOTRIN) 200 MG tablet Take 400 mg by mouth every 6 (six) hours as needed.    Historical Provider, MD  oxyCODONE-acetaminophen (PERCOCET) 5-325 MG per tablet Take 2 tablets by mouth every 4 (four) hours as needed. Patient not taking: Reported on 10/01/2014 07/20/14   Wayland SalinasJohn Bednar, MD  sulfamethoxazole-trimethoprim (BACTRIM DS) 800-160 MG per tablet Take 1 tablet by mouth 2 (two)  times daily. Patient not taking: Reported on 10/01/2014 07/22/14   Elwin Mocha, MD   BP 138/95 mmHg  Pulse 84  Temp(Src) 98.3 F (36.8 C) (Oral)  Resp 16  Ht  (1.651 m)  Wt 230 lb (104.327 kg)  BMI 38.27 kg/m2  SpO2 100%  LMP 07/28/2015 Physical Exam  Constitutional: She is oriented to person, place, and time. She appears well-developed and well-nourished. No distress.  HENT:  Head: Normocephalic and atraumatic.  Eyes: EOM are normal. Pupils are equal, round, and reactive to light.  Right conjunctival injection. There is a corneal abrasion noted at the 3 oclock and 9oclock position around the iris noted on the  fluorescin dye exam.   Neck: Normal range of motion.  Cardiovascular: Normal rate and regular rhythm.  Exam reveals no gallop and no friction rub.   No murmur heard. Pulmonary/Chest: Effort normal and breath sounds normal. She has no wheezes. She has no rales. She exhibits no tenderness.  Abdominal: Soft. She exhibits no distension. There is no tenderness. There is no rebound.  Musculoskeletal: Normal range of motion.  Neurological: She is alert and oriented to person, place, and time. Coordination normal.  Speech is goal-oriented. Moves limbs without ataxia.   Skin: Skin is warm and dry.  Psychiatric: She has a normal mood and affect. Her behavior is normal.  Nursing note and vitals reviewed.   ED Course  Procedures (including critical care time) Labs Review Labs Reviewed - No data to display  Imaging Review No results found. I have personally reviewed and evaluated these images and lab results as part of my medical decision-making.   EKG Interpretation None      MDM   Final diagnoses:  Corneal abrasion, right, initial encounter   3:49 AM Patient has a corneal abrasion of the right eye. Patient will be treated with polytrim drops. Vitals stable and patient afebrile.    Emilia Beck, PA-C 08/05/15 [id number]  Tomasita Crumble, MD 08/05/15 [phone]

## 2015-08-04 NOTE — Discharge Instructions (Signed)
Use polytrim drops as directed until symptoms resolve. Refer to attached documents for more information.  °

## 2015-08-04 NOTE — ED Notes (Signed)
Pt. Was playing with her daughter and her daughter accidentally scratched her eye. Pt. States her eye has been watery ever since.

## 2015-08-04 NOTE — ED Provider Notes (Signed)
CSN: [id number]   Arrival date & time 08/04/15 0149  History  By signing my name below, I, Annette Jones, attest that this documentation has been prepared under the direction and in the presence of Tomasita Crumble, MD. Electronically Signed: Bethel Jones, ED Scribe. 08/04/2015. 4:04 AM.  Chief Complaint  Patient presents with  . Eye Injury    HPI The history is provided by the patient. No language interpreter was used.   Annette Jones is a 29 y.o. female who presents to the Emergency Department complaining of right eye injury with onset yesterday around 4 PM. The pt was playing with her daughter who scratched her eye with her fingernail. Associated symptoms include right eye irritation, redness, and swelling. Visine application provided no relief in the sensation of irritation.  Pt denies other injury.   Past Medical History  Diagnosis Date  . Urinary tract infection   . Cellulitis   . Chlamydia   . 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 Infection     UTI X 1  . Infection 2009    CHLAMYDIA  . Infection     HX OF FREQUENT YEAST  . Infection 2012    CELLULITIS  . Elevated hemoglobin A1c 04/14/2012    5.9 at [facility] interview--plan early glucola at 18 weeks.     Past Surgical History  Procedure Laterality Date  . Induced abortion    . No past surgeries      Family History  Problem Relation Age of Onset  . Other Neg Hx   . Hypertension Mother   . Diabetes Maternal Grandmother   . Hypertension Maternal Grandmother   . Hypertension Cousin     Social History  Substance Use Topics  . Smoking status: Former Smoker    Types: Cigarettes  . Smokeless tobacco: Never Used  . Alcohol Use: 3.5 oz/week    7 drink(s) per week     Comment: 2 GLASSES WINE /DAY; LAST DRANK 01/27/2012     Review of Systems 10 Systems reviewed and all are negative for acute change except as noted in the HPI. Home Medications   Prior to Admission medications   Medication Sig Start Date End Date  Taking? Authorizing Provider  cephALEXin (KEFLEX) 500 MG capsule 2 caps po bid x 7 days Patient not taking: Reported on 10/01/2014 07/20/14   Wayland Salinas, MD  clindamycin (CLEOCIN) 300 MG capsule Take 1 capsule (300 mg total) by mouth 4 (four) times daily. X 7 days 10/01/14   Geoffery Lyons, MD  etonogestrel (IMPLANON) 68 MG IMPL implant Inject 1 each into the skin once.    Historical Provider, MD  ibuprofen (ADVIL,MOTRIN) 200 MG tablet Take 400 mg by mouth every 6 (six) hours as needed.    Historical Provider, MD  oxyCODONE-acetaminophen (PERCOCET) 5-325 MG per tablet Take 2 tablets by mouth every 4 (four) hours as needed. Patient not taking: Reported on 10/01/2014 07/20/14   Wayland Salinas, MD  sulfamethoxazole-trimethoprim (BACTRIM DS) 800-160 MG per tablet Take 1 tablet by mouth 2 (two) times daily. Patient not taking: Reported on 10/01/2014 07/22/14   Elwin Mocha, MD    Allergies  Percocet  Triage Vitals: BP 138/95 mmHg  Pulse 84  Temp(Src) 98.3 F (36.8 C) (Oral)  Resp 16  Ht  (1.651 m)  Wt 230 lb (104.327 kg)  BMI 38.27 kg/m2  SpO2 100%  LMP 07/28/2015  Physical Exam  Constitutional: She is oriented to person, place, and time. She appears well-developed and  well-nourished. No distress.  HENT:  Head: Normocephalic and atraumatic.  Nose: Nose normal.  Mouth/Throat: Oropharynx is clear and moist. No oropharyngeal exudate.  Eyes: EOM are normal. Pupils are equal, round, and reactive to light. No scleral icterus.  Corneal abrasion of the right eye at 6 o'clock position. Injected right conjunctiva. 20/20 vision bilaterally.   Neck: Normal range of motion. Neck supple. No JVD present. No tracheal deviation present. No thyromegaly present.  Cardiovascular: Normal rate, regular rhythm and normal heart sounds.  Exam reveals no gallop and no friction rub.   No murmur heard. Pulmonary/Chest: Effort normal and breath sounds normal. No respiratory distress. She has no wheezes. She exhibits no  tenderness.  Abdominal: Soft. Bowel sounds are normal. She exhibits no distension and no mass. There is no tenderness. There is no rebound and no guarding.  Musculoskeletal: Normal range of motion. She exhibits no edema or tenderness.  Lymphadenopathy:    She has no cervical adenopathy.  Neurological: She is alert and oriented to person, place, and time. No cranial nerve deficit. She exhibits normal muscle tone.  Skin: Skin is warm and dry. No rash noted. No erythema. No pallor.  Nursing note and vitals reviewed.   ED Course  Procedures   DIAGNOSTIC STUDIES: Oxygen Saturation is 100% on RA, normal by my interpretation.    COORDINATION OF CARE: 3:27 AM Discussed treatment plan which includes f/u with opthalmology with pt at bedside and pt agreed to plan.    MDM   Final diagnoses:  Corneal abrasion, right, initial encounter   Patient presents to the ED for scratch on her eye.  PE reveals a corneal abrasion.  Education provided, ophtho follow up provided.  She appears well and in NAD.  VS remain within her normal limits and she is safe for DC.   I personally performed the services described in this documentation, which was scribed in my presence. The recorded information has been reviewed and is accurate.      Tomasita CrumbleAdeleke Marciel Offenberger, MD 08/04/15 [phone]

## 2015-08-04 NOTE — ED Notes (Signed)
Pt. Left with all belongings and refused wheelchair. Discharge instructions were reviewed and all questions were answered.  

## 2015-08-06 ENCOUNTER — Inpatient Hospital Stay (HOSPITAL_COMMUNITY)
Admission: EM | Admit: 2015-08-06 | Discharge: 2015-08-08 | DRG: 603 | Disposition: A | Discharge status: Home / Self Care | Attending: Infectious Diseases | Admitting: Infectious Diseases

## 2015-08-06 ENCOUNTER — Emergency Department (HOSPITAL_COMMUNITY)

## 2015-08-06 ENCOUNTER — Encounter (HOSPITAL_COMMUNITY): Payer: Self-pay | Admitting: Emergency Medicine

## 2015-08-06 DIAGNOSIS — R509 Fever, unspecified: Secondary | ICD-10-CM | POA: Diagnosis present

## 2015-08-06 DIAGNOSIS — L03119 Cellulitis of unspecified part of limb: Secondary | ICD-10-CM | POA: Diagnosis present

## 2015-08-06 DIAGNOSIS — L03116 Cellulitis of left lower limb: Secondary | ICD-10-CM | POA: Diagnosis present

## 2015-08-06 DIAGNOSIS — E669 Obesity, unspecified: Secondary | ICD-10-CM | POA: Diagnosis present

## 2015-08-06 DIAGNOSIS — L02419 Cutaneous abscess of limb, unspecified: Secondary | ICD-10-CM | POA: Diagnosis present

## 2015-08-06 DIAGNOSIS — R7302 Impaired glucose tolerance (oral): Secondary | ICD-10-CM | POA: Diagnosis not present

## 2015-08-06 DIAGNOSIS — Z6839 Body mass index (BMI) 39.0-39.9, adult: Secondary | ICD-10-CM

## 2015-08-06 DIAGNOSIS — B9689 Other specified bacterial agents as the cause of diseases classified elsewhere: Secondary | ICD-10-CM | POA: Diagnosis not present

## 2015-08-06 DIAGNOSIS — R7303 Prediabetes: Secondary | ICD-10-CM | POA: Diagnosis not present

## 2015-08-06 HISTORY — DX: Candidiasis, unspecified: B37.9

## 2015-08-06 HISTORY — DX: Prediabetes: R73.03

## 2015-08-06 HISTORY — DX: Cellulitis of unspecified part of limb: L03.119

## 2015-08-06 LAB — CBC WITH DIFFERENTIAL/PLATELET
BASOS ABS: 0 10*3/uL (ref 0.0–0.1)
BASOS PCT: 0 %
EOS ABS: 0 10*3/uL (ref 0.0–0.7)
EOS PCT: 0 %
HCT: 35.2 % — ABNORMAL LOW (ref 36.0–46.0)
Hemoglobin: 11.7 g/dL — ABNORMAL LOW (ref 12.0–15.0)
LYMPHS PCT: 6 %
Lymphs Abs: 1.1 10*3/uL (ref 0.7–4.0)
MCH: 27.7 pg (ref 26.0–34.0)
MCHC: 33.2 g/dL (ref 30.0–36.0)
MCV: 83.4 fL (ref 78.0–100.0)
MONO ABS: 0.9 10*3/uL (ref 0.1–1.0)
Monocytes Relative: 5 %
Neutro Abs: 15.5 10*3/uL — ABNORMAL HIGH (ref 1.7–7.7)
Neutrophils Relative %: 89 %
PLATELETS: 241 10*3/uL (ref 150–400)
RBC: 4.22 MIL/uL (ref 3.87–5.11)
RDW: 13.1 % (ref 11.5–15.5)
WBC: 17.5 10*3/uL — AB (ref 4.0–10.5)

## 2015-08-06 LAB — URINALYSIS, ROUTINE W REFLEX MICROSCOPIC
BILIRUBIN URINE: NEGATIVE
Glucose, UA: NEGATIVE mg/dL
KETONES UR: 40 mg/dL — AB
NITRITE: NEGATIVE
PROTEIN: 30 mg/dL — AB
SPECIFIC GRAVITY, URINE: 1.016 (ref 1.005–1.030)
UROBILINOGEN UA: 1 mg/dL (ref 0.0–1.0)
pH: 6 (ref 5.0–8.0)

## 2015-08-06 LAB — RAPID URINE DRUG SCREEN, HOSP PERFORMED
AMPHETAMINES: NOT DETECTED
BENZODIAZEPINES: NOT DETECTED
Barbiturates: NOT DETECTED
Cocaine: NOT DETECTED
OPIATES: NOT DETECTED
Tetrahydrocannabinol: NOT DETECTED

## 2015-08-06 LAB — COMPREHENSIVE METABOLIC PANEL
ALK PHOS: 56 U/L (ref 38–126)
ALT: 17 U/L (ref 14–54)
ANION GAP: 9 (ref 5–15)
AST: 21 U/L (ref 15–41)
Albumin: 3.6 g/dL (ref 3.5–5.0)
BUN: 7 mg/dL (ref 6–20)
CALCIUM: 8.9 mg/dL (ref 8.9–10.3)
CO2: 21 mmol/L — AB (ref 22–32)
Chloride: 103 mmol/L (ref 101–111)
Creatinine, Ser: 1.18 mg/dL — ABNORMAL HIGH (ref 0.44–1.00)
Glucose, Bld: 118 mg/dL — ABNORMAL HIGH (ref 65–99)
Potassium: 3.5 mmol/L (ref 3.5–5.1)
SODIUM: 133 mmol/L — AB (ref 135–145)
Total Bilirubin: 0.6 mg/dL (ref 0.3–1.2)
Total Protein: 7 g/dL (ref 6.5–8.1)

## 2015-08-06 LAB — URINE MICROSCOPIC-ADD ON

## 2015-08-06 LAB — I-STAT CG4 LACTIC ACID, ED
LACTIC ACID, VENOUS: 1.14 mmol/L (ref 0.5–2.0)
Lactic Acid, Venous: 0.58 mmol/L (ref 0.5–2.0)

## 2015-08-06 MED ORDER — DEXTROSE 5 % IV SOLN
1.0000 g | Freq: Once | INTRAVENOUS | Status: AC
  Administered 2015-08-06: 1 g via INTRAVENOUS
  Filled 2015-08-06: qty 10

## 2015-08-06 MED ORDER — ENOXAPARIN SODIUM 40 MG/0.4ML ~~LOC~~ SOLN: 40.0000 mg | SUBCUTANEOUS | Status: DC

## 2015-08-06 MED ORDER — CEFTRIAXONE SODIUM 2 G IJ SOLR
2.0000 g | INTRAMUSCULAR | Status: DC
  Filled 2015-08-06: qty 2

## 2015-08-06 MED ORDER — SODIUM CHLORIDE 0.9 % IV BOLUS (SEPSIS)
1000.0000 mL | Freq: Once | INTRAVENOUS | Status: AC
  Administered 2015-08-06: 1000 mL via INTRAVENOUS

## 2015-08-06 MED ORDER — VANCOMYCIN HCL 10 G IV SOLR
2000.0000 mg | INTRAVENOUS | Status: AC
  Administered 2015-08-06: 2000 mg via INTRAVENOUS
  Filled 2015-08-06: qty 2000

## 2015-08-06 MED ORDER — ACETAMINOPHEN 325 MG PO TABS
650.0000 mg | ORAL_TABLET | Freq: Four times a day (QID) | ORAL | Status: DC | PRN
  Administered 2015-08-06 – 2015-08-07 (×4): 650 mg via ORAL
  Filled 2015-08-06 (×4): qty 2

## 2015-08-06 MED ORDER — ACETAMINOPHEN 325 MG PO TABS
650.0000 mg | ORAL_TABLET | Freq: Once | ORAL | Status: AC | PRN
  Administered 2015-08-06: 650 mg via ORAL
  Filled 2015-08-06: qty 2

## 2015-08-06 MED ORDER — DEXTROSE 5 % IV SOLN
2.0000 g | INTRAVENOUS | Status: DC
  Administered 2015-08-07 – 2015-08-08 (×2): 2 g via INTRAVENOUS
  Filled 2015-08-06 (×2): qty 2

## 2015-08-06 MED ORDER — SODIUM CHLORIDE 0.9 % IV SOLN
750.0000 mg | Freq: Two times a day (BID) | INTRAVENOUS | Status: DC
Start: 2015-08-07 — End: 2015-08-08
  Administered 2015-08-07 – 2015-08-08 (×3): 750 mg via INTRAVENOUS
  Filled 2015-08-06 (×5): qty 750

## 2015-08-06 NOTE — ED Provider Notes (Signed)
CSN: [id number]     Arrival date & time 08/06/15  1031 History   First MD Initiated Contact with Patient 08/06/15 1127     Chief Complaint  Patient presents with  . Recurrent Skin Infections  . Fever     (Consider location/radiation/quality/duration/timing/severity/associated sxs/prior Treatment) The history is provided by the patient.   Patient is a 29yo female with history of recurrent cellulitis who presents with 1 day of left leg pain, redness and fever. Patient reports that she always gets cellulitis in her eft lower leg. First noticed the symptoms yesterday. No known skin trauma or injury. Endorses subjective fevers yesterday. Has only tried tylenol this morning for the pain which has helped some. Overall, feels like pain and redness are worsening since yesterday.   Past Medical History  Diagnosis Date  . Urinary tract infection   . Cellulitis   . Chlamydia   . 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 Infection     UTI X 1  . Infection 2009    CHLAMYDIA  . Infection     HX OF FREQUENT YEAST  . Infection 2012    CELLULITIS  . Elevated hemoglobin A1c 04/14/2012    5.9 at [facility] interview--plan early glucola at 18 weeks.    Past Surgical History  Procedure Laterality Date  . Induced abortion    . No past surgeries     Family History  Problem Relation Age of Onset  . Other Neg Hx   . Hypertension Mother   . Diabetes Maternal Grandmother   . Hypertension Maternal Grandmother   . Hypertension Cousin    Social History  Substance Use Topics  . Smoking status: Former Smoker    Types: Cigarettes  . Smokeless tobacco: Never Used  . Alcohol Use: 3.5 oz/week    7 drink(s) per week     Comment: 2 GLASSES WINE /DAY; LAST DRANK 01/27/2012   OB History    Gravida Para Term Preterm AB TAB SAB Ectopic Multiple Living   0    1     Review of Systems  Constitutional: Positive for fever.  HENT: Negative.   Eyes: Negative.   Respiratory: Negative.   Cardiovascular:  Negative.   Gastrointestinal: Negative.   Endocrine: Negative.   Genitourinary: Negative.   Musculoskeletal: Negative.   Skin: Negative.   Allergic/Immunologic: Negative.   Neurological: Negative.   Hematological: Negative.   Psychiatric/Behavioral: Negative.       Allergies  Percocet  Home Medications   Prior to Admission medications   Medication Sig Start Date End Date Taking? Authorizing Provider  cephALEXin (KEFLEX) 500 MG capsule 2 caps po bid x 7 days Patient not taking: Reported on 10/01/2014 07/20/14   Wayland Salinas, MD  clindamycin (CLEOCIN) 300 MG capsule Take 1 capsule (300 mg total) by mouth 4 (four) times daily. X 7 days 10/01/14   Geoffery Lyons, MD  etonogestrel (IMPLANON) 68 MG IMPL implant Inject 1 each into the skin once.    Historical Provider, MD  ibuprofen (ADVIL,MOTRIN) 200 MG tablet Take 400 mg by mouth every 6 (six) hours as needed.    Historical Provider, MD  oxyCODONE-acetaminophen (PERCOCET) 5-325 MG per tablet Take 2 tablets by mouth every 4 (four) hours as needed. Patient not taking: Reported on 10/01/2014 07/20/14   Wayland Salinas, MD  sulfamethoxazole-trimethoprim (BACTRIM DS) 800-160 MG per tablet Take 1 tablet by mouth 2 (two) times daily. Patient not taking: Reported on 10/01/2014 07/22/14  Elwin MochaBlair Walden, MD   BP 127/83 mmHg  Pulse 106  Temp(Src) 101.1 F (38.4 C) (Oral)  Resp 17  Ht 5\' 5"  (1.651 m)  Wt 236 lb (107.049 kg)  BMI 39.27 kg/m2  SpO2 98%  LMP 07/28/2015 Physical Exam  Constitutional: She is oriented to person, place, and time. She appears well-developed and well-nourished.  HENT:  Head: Normocephalic and atraumatic.  Eyes: EOM are normal. Pupils are equal, round, and reactive to light.  Neck: Normal range of motion. Neck supple.  Cardiovascular: Regular rhythm.  Tachycardia present.   Pulmonary/Chest: Effort normal and breath sounds normal.  Abdominal: Soft. Bowel sounds are normal. She exhibits no distension. There is no tenderness.   Musculoskeletal: Normal range of motion.  Neurological: She is alert and oriented to person, place, and time.  Skin: Skin is warm and dry.  Approximately 20cm diameter area or erythema noted on left lower leg with small abrasion noted on anterior shin. No purulence or drainage noted.   Psychiatric: She has a normal mood and affect. Her behavior is normal.  Nursing note and vitals reviewed.   ED Course  Procedures (including critical care time) Labs Review Labs Reviewed  COMPREHENSIVE METABOLIC PANEL - Abnormal; Notable for the following:    Sodium 133 (*)    CO2 21 (*)    Glucose, Bld 118 (*)    Creatinine, Ser 1.18 (*)    All other components within normal limits  CBC WITH DIFFERENTIAL/PLATELET - Abnormal; Notable for the following:    WBC 17.5 (*)    Hemoglobin 11.7 (*)    HCT 35.2 (*)    Neutro Abs 15.5 (*)    All other components within normal limits  CULTURE, BLOOD (ROUTINE X 2)  CULTURE, BLOOD (ROUTINE X 2)  URINE CULTURE  URINALYSIS, ROUTINE W REFLEX MICROSCOPIC (NOT AT [facility])  I-STAT CG4 LACTIC ACID, ED    Imaging Review Dg Chest 2 View  08/06/2015  CLINICAL DATA:  Fever. Shortness of breath on exertion. Left lower extremity cellulitis. EXAM: CHEST - 2 VIEW COMPARISON:  None. FINDINGS: The heart size and mediastinal contours are within normal limits. Both lungs are clear. The visualized skeletal structures are unremarkable. IMPRESSION: No active disease. Electronically Signed   By: Marin Robertshristopher  Mattern M.D.   On: 08/06/2015 11:50   I have personally reviewed and evaluated these images and lab results as part of my medical decision-making.  MDM   Final diagnoses:  Cellulitis of left lower extremity    1:12 PM 29yo female presenting with LLE cellulitis. Meets sepsis criteria. Leukocytosis with left shift. Lactate not elevated. Blood cultures obtained. Will start on IV ceftriaxone and admit for observation.   1:26 PM Discussed case with internal medicine, will see  and admit patient.    Ardith Darkaleb M Parker, MD 08/06/15 1327  Nelva Nayobert Beaton, MD 08/07/15 [phone]

## 2015-08-06 NOTE — H&P (Signed)
Date: 08/06/2015               Patient Name:  Annette Jones MRN: [id number]  DOB: January 20, 1986 Age / Sex: 29 y.o., female   PCP: No Pcp Per Patient         Medical Service: Internal Medicine Teaching Service         Attending Physician: Dr. Ginnie Smart, MD    First Contact: Dr. Dimple Casey Pager: [phone]  Second Contact: Dr. Beckie Salts Pager: [phone]       After Hours (After 5p/  First Contact Pager: [phone]  weekends / holidays): Second Contact Pager: [phone]   Chief Complaint: LLE cellulitis  History of Present Illness: 29 y/o AAF presents to ED for left lower extremity cellulitis after hitting her leg on a bed frame. She developed fever and chills yesterday overnight which continued throughout today. She notes erythema, mild swelling, and mild pain that has not worsened on walking. She took Tylenol this morning with minimal relief. At the emergency department she was noted to be febrile and otherwise stable and started on ceftriaxone IV. Leukocytosis of 17.5 with 15.5 absolute neutrophils. Blood cultures were obtained. 2 view chest x-ray showed no acute findings.  Her history is significant for recurrent infections to her lower extremities for many years, including most recently October 2015 in January 2016. These have generally improved with oral antibiotics without significant systemic symptoms. She also has history of multiple UTIs and was treated for chlamydia in 2009. She is recently evaluated for impaired fasting glucose with last hemoglobin A1c 5.9%. She was seen 2 days ago for corneal abrasion of the right eye discharged with eyedrops.  Meds: Current Facility-Administered Medications  Medication Dose Route Frequency Provider Last Rate Last Dose  . acetaminophen (TYLENOL) tablet 650 mg  650 mg Oral Q6H PRN Marrian Salvage, MD      . cefTRIAXone (ROCEPHIN) 2 g in dextrose 5 % 50 mL IVPB  2 g Intravenous Q24H Marrian Salvage, MD      . enoxaparin (LOVENOX) injection 40 mg  40 mg  Subcutaneous Q24H Marrian Salvage, MD        Allergies: Allergies as of 08/06/2015 - Review Complete 08/06/2015  Allergen Reaction Noted  . Percocet [oxycodone-acetaminophen] Itching 10/01/2014   Past Medical History  Diagnosis Date  . Urinary tract infection   . Cellulitis   . Chlamydia   . 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 Infection     UTI X 1  . Infection 2009    CHLAMYDIA  . Infection     HX OF FREQUENT YEAST  . Infection 2012    CELLULITIS  . Elevated hemoglobin A1c 04/14/2012    5.9 at NOB interview--plan early glucola at 18 weeks.    Past Surgical History  Procedure Laterality Date  . Induced abortion    . No past surgeries     Family History  Problem Relation Age of Onset  . Other Neg Hx   . Hypertension Mother   . Diabetes Maternal Grandmother   . Hypertension Maternal Grandmother   . Hypertension Cousin    Social History   Social History  . Marital Status: Legally Separated    Spouse Name: N/A  . Number of Children: N/A  . Years of Education: 16   Occupational History  . CARE GIVER    Social History Main Topics  . Smoking status: Former Smoker    Types: Cigarettes  . Smokeless tobacco: Never  Used  . Alcohol Use: 3.5 oz/week    7 drink(s) per week     Comment: 2 GLASSES WINE /DAY; LAST DRANK 01/27/2012  . Drug Use: No  . Sexual Activity:    Partners: Male    Birth Control/ Protection: None   Other Topics Concern  . Not on file   Social History Narrative    Review of Systems: Review of Systems  Constitutional: Positive for fever and chills.  Eyes: Negative for blurred vision.  Respiratory: Negative for cough.   Cardiovascular: Negative for chest pain.  Gastrointestinal: Negative for nausea, vomiting and diarrhea.  Genitourinary: Negative for dysuria.  Musculoskeletal: Negative for myalgias.  Skin: Positive for rash.  Neurological: Negative for dizziness and headaches.    Physical Exam: Blood pressure 109/63, pulse 88,  temperature 99.3 F (37.4 C), temperature source Oral, resp. rate 16, height 5\' 5"  (1.651 m), weight 107.049 kg (236 lb), last menstrual period 07/28/2015, SpO2 100 %.  GENERAL- alert, co-operative, NAD HEENT- Atraumatic, PERRL, oral mucosa appears moist, no cervical LN enlargement, dental caries in at least one tooth. CARDIAC- RRR, no murmurs, rubs or gallops. RESP- CTAB, no wheezes or crackles. ABDOMEN- Soft, nontender, no guarding or rebound, normoactive bowel sounds present BACK- Normal curvature, no paraspinal tenderness, no CVA tenderness. NEURO- Sensation intact- globally EXTREMITIES- pulse 2+, symmetric, LLE 0.5x0.5cm wound at one third distance from knee to ankle with uniform erythema surrounding the site and inferior to it including entire foot, movement and sensation intact, without localizing fluid collection, without significant induration, without purulence or crusting. PSYCH- Normal mood and affect, appropriate thought content and speech.  Lab results: Basic Metabolic Panel:  Recent Labs  95/62/1310/05/13 1119  NA 133*  K 3.5  CL 103  CO2 21*  GLUCOSE 118*  BUN 7  CREATININE 1.18*  CALCIUM 8.9   Liver Function Tests:  Recent Labs  08/06/15 1119  AST 21  ALT 17  ALKPHOS 56  BILITOT 0.6  PROT 7.0  ALBUMIN 3.6   No results for input(s): LIPASE, AMYLASE in the last 72 hours. No results for input(s): AMMONIA in the last 72 hours. CBC:  Recent Labs  08/06/15 1119  WBC 17.5*  NEUTROABS 15.5*  HGB 11.7*  HCT 35.2*  MCV 83.4  PLT 241   Cardiac Enzymes: No results for input(s): CKTOTAL, CKMB, CKMBINDEX, TROPONINI in the last 72 hours. BNP: No results for input(s): PROBNP in the last 72 hours. D-Dimer: No results for input(s): DDIMER in the last 72 hours. CBG: No results for input(s): GLUCAP in the last 72 hours. Hemoglobin A1C: No results for input(s): HGBA1C in the last 72 hours. Fasting Lipid Panel: No results for input(s): CHOL, HDL, LDLCALC, TRIG,  CHOLHDL, LDLDIRECT in the last 72 hours. Thyroid Function Tests: No results for input(s): TSH, T4TOTAL, FREET4, T3FREE, THYROIDAB in the last 72 hours. Anemia Panel: No results for input(s): VITAMINB12, FOLATE, FERRITIN, TIBC, IRON, RETICCTPCT in the last 72 hours. Coagulation: No results for input(s): LABPROT, INR in the last 72 hours. Urine Drug Screen: Drugs of Abuse  No results found for: LABOPIA, COCAINSCRNUR, LABBENZ, AMPHETMU, THCU, LABBARB  Alcohol Level: No results for input(s): ETH in the last 72 hours. Urinalysis: No results for input(s): COLORURINE, LABSPEC, PHURINE, GLUCOSEU, HGBUR, BILIRUBINUR, KETONESUR, PROTEINUR, UROBILINOGEN, NITRITE, LEUKOCYTESUR in the last 72 hours.  Invalid input(s): APPERANCEUR   Imaging results:  Dg Chest 2 View  08/06/2015  CLINICAL DATA:  Fever. Shortness of breath on exertion. Left lower extremity cellulitis. EXAM: CHEST -  2 VIEW COMPARISON:  None. FINDINGS: The heart size and mediastinal contours are within normal limits. Both lungs are clear. The visualized skeletal structures are unremarkable. IMPRESSION: No active disease. Electronically Signed   By: Marin Roberts M.D.   On: 08/06/2015 11:50    Assessment & Plan by Problem: Cellulitis of left lower extremity: Uniform erythema of left lower extremity distal to a small wound obtained approximately 1 day ago. Her systemic symptoms include fever and chills. There may be a component of venous stasis or lymphatic dysfunction in this leg with her history of repeated infections over many years. Some risk factors could also be possible thrombosis with her chemical contraception, or valvular dysfunction/varicosities since her history of pregnancy and obesity. -Lower extremity doppler US for DVT -Start ceftriaxone 2g IV and vancomycin dosing per pharmacy consult -F/U blood Cx -Acetaminophen  q6hrs PRN  Impaired fasting glucose: likely underlying risk factor for her recurrent soft tissue  infections. Hemoglobin A1c was 5.9%, this should be repeated this recent ED evaluations may be worsened by untreated chronic mild hyperglycemia. -Repeat Hgba1c  Diet: Carb modified DVT ppx: [place] enoxaparin FULL CODE  Dispo: Disposition is deferred at this time, awaiting improvement of current medical problems. Anticipated discharge in approximately 3 day(s).   The patient does not have a current PCP (No Pcp Per Patient) and does not know need an [facility] hospital follow-up appointment after discharge.  The patient does not know have transportation limitations that hinder transportation to clinic appointments.  Signed: Fuller Plan, MD 08/06/2015, 3:14 PM

## 2015-08-06 NOTE — Progress Notes (Signed)
Pt admitted to the unit at 1455. Pt mental status is alert, verbal, oriented x4. Pt oriented to room, staff, and call bell. Skin is intact other than LLL redness/abscess. Full assessment charted in [facility]. Call bell within reach. Visitor guidelines reviewed w/ pt and/or family.

## 2015-08-06 NOTE — ED Notes (Signed)
Pt to Xray.

## 2015-08-06 NOTE — ED Notes (Signed)
Pt still in x-ray. Pt's belongings brought from triage to rm A13. [place] to inform x-ray to bring pt to A13.

## 2015-08-06 NOTE — Progress Notes (Addendum)
Date: 08/06/2015  Patient name: Annette Jones  Medical record number: [id number]  Date of birth: 1985-11-06   I have seen and evaluated Annette Jones and discussed their care with the Residency Team.   Assessment and Plan: I have seen and evaluated the patient as outlined above. 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 Her last episode was in November of 2015. She returns to the hospital today with swelling and erythema in her left lower extremity for the last 24 hours. She noted a wound on her pretibial area on her left lower extremity after bumping it on her bed frame yesterday. She has had fevers and chills. She has not taken her temperature at home.  Past medical history:  elevated glucose tolerance test. Implantable birth control   Social history: she does not smoke or drink. She does not recreational drugs. She has no pets. Family history: Her parents are healthy. She has a 54-year-old daughter who is healthy.  Review of systems: Last tetanus shot was 2 years ago. Decreased appetite. No dysphagia. Normal menses. No dysuria. Normal bowel movements. 12 point review of system is otherwise negative.  Filed Vitals:   08/06/15 1400  BP: 121/85  Pulse: 93  Temp: 103.1  Resp:   Eyes: EOMI, PERRL Mouth shows poor dentition with a exposed root on the left maxillary side.  Neck: nontender no lymphadenopathy.  Chest: clear auscultation.  CV: regular rate and rhythm.  Abdomen: BS positive, soft nontender.  Extremities: Her left lower extremity is moderately swollen versus her right. It is warm. There is erythema extending to her two thirds of the way up of her anterior pretibial area. Homans sign is negative. She has no palpable cords in her left calf. She has a roughly 0.5 cm x 0.25 cm wound on her  left pretibial area. It is non-fluctuant, there is no discharge.  Recent Results (from the past 2160 hour(s))  Comprehensive metabolic panel     Status: Abnormal   Collection Time: 08/06/15 11:19 AM  Result Value Ref Range   Sodium 133 (L) 135 - 145 mmol/L   Potassium 3.5 3.5 - 5.1 mmol/L   Chloride 103 101 - 111 mmol/L   CO2 21 (L) 22 - 32 mmol/L   Glucose, Bld 118 (H) 65 - 99 mg/dL   BUN 7 6 - 20 mg/dL   Creatinine, Ser 1.18 (H) 0.44 - 1.00 mg/dL   Calcium 8.9 8.9 - 10.3 mg/dL   Total Protein 7.0 6.5 - 8.1 g/dL   Albumin 3.6 3.5 - 5.0 g/dL   AST 21 15 - 41 U/L   ALT 17 14 - 54 U/L   Alkaline Phosphatase 56 38 - 126 U/L   Total Bilirubin 0.6 0.3 - 1.2 mg/dL   GFR calc non Af Amer >60 >60 mL/min   GFR calc Af Amer >60 >60 mL/min    Comment: (NOTE) The eGFR has been calculated using the CKD EPI equation. This calculation has not been validated in all clinical situations. eGFR's persistently <60 mL/min signify possible Chronic Kidney Disease.    Anion gap 9 5 - 15  CBC with Differential     Status: Abnormal   Collection Time: 08/06/15 11:19 AM  Result Value Ref Range   WBC 17.5 (H) 4.0 - 10.5 K/uL   RBC 4.22 3.87 - 5.11  MIL/uL   Hemoglobin 11.7 (L) 12.0 - 15.0 g/dL   HCT 35.2 (L) 36.0 - 46.0 %   MCV 83.4 78.0 - 100.0 fL   MCH 27.7 26.0 - 34.0 pg   MCHC 33.2 30.0 - 36.0 g/dL   RDW 13.1 11.5 - 15.5 %   Platelets 241 150 - 400 K/uL   Neutrophils Relative % 89 %   Neutro Abs 15.5 (H) 1.7 - 7.7 K/uL   Lymphocytes Relative 6 %   Lymphs Abs 1.1 0.7 - 4.0 K/uL   Monocytes Relative 5 %   Monocytes Absolute 0.9 0.1 - 1.0 K/uL   Eosinophils Relative 0 %   Eosinophils Absolute 0.0 0.0 - 0.7 K/uL   Basophils Relative 0 %   Basophils Absolute 0.0 0.0 - 0.1 K/uL  I-Stat CG4 Lactic Acid, ED  (not at [facility])     Status: None   Collection Time: 08/06/15 11:36 AM  Result Value Ref Range   Lactic Acid, Venous 1.14 0.5 - 2.0 mmol/L  I-Stat CG4 Lactic Acid, ED  (not at [facility])     Status:  None   Collection Time: 08/06/15  2:03 PM  Result Value Ref Range   Lactic Acid, Venous 0.58 0.5 - 2.0 mmol/L    1. Left lower extremity cellulitis She may have a self-perpetuating process now of recurrent cellulitis. Her previous infections may have caused lymphatic as well as venous damage which give her a predilection for developing recurrent infections Would start her on vancomycin and ceftriaxone. Check blood cultures Check HIV and hepatitis serologies Check left lower extremity doppler Her TDap was last given January of 2014.  2. Pre-diabetes Jones check her Hgb A1C Counsel regarding wt loss, diet and exercise. Nutrition eval.   Campbell Riches, MD 11/8/20162:34 PM

## 2015-08-06 NOTE — Consult Note (Signed)
PHARMACY CONSULT NOTE   Pharmacy Consult for :   Vancomycin  Indication:  Recurrent Cellulitis  Hospital Problems: Principal Problem:   Cellulitis of left lower extremity Active Problems:   Pre-diabetes   Recurrent cellulitis of lower leg Allergies: Allergies  Allergen Reactions  . Percocet [Oxycodone-Acetaminophen] Itching   Patient Measurements: Height:  (165.1 cm) Weight: 236 lb (107.049 kg) IBW/kg (Calculated) : 57  Vancomycin Dosing Weight:  107 kg    Vital Signs: Temp: 99.3 F (37.4 C) (11/08 1457) Temp Source: Oral (11/08 1457) BP: 109/63 mmHg (11/08 1457) Pulse Rate: 88 (11/08 1457)  Labs:  Recent Labs  08/06/15 1119  WBC 17.5*  HGB 11.7*  PLT 241  CREATININE 1.18*   Estimated Creatinine Clearance: 84 mL/min (by C-G formula based on Cr of 1.18).  Microbiology: No results found for this or any previous visit (from the past 720 hour(s)).  Medical/Surgical History: Past Medical History  Diagnosis Date  . Urinary tract infection   . Infection     UTI X 1  . Chlamydia 2009    CHLAMYDIA  . Yeast infection     frequent  . Cellulitis of lower leg 2012    "left"  . Elevated hemoglobin A1c 04/14/2012    5.9 at NOB interview--plan early glucola at 18 weeks.   . Borderline diabetes    Past Surgical History  Procedure Laterality Date  . Induced abortion      Current Medication[s] Include: Prior to Admission: Prescriptions prior to admission  Medication Sig Dispense Refill Last Dose  . acetaminophen (TYLENOL) 325 MG tablet Take 650 mg by mouth every 6 (six) hours as needed for mild pain.   Past Week at Unknown time  . etonogestrel (IMPLANON) 68 MG IMPL implant Inject 1 each into the skin once.   unk at [employer]  . cephALEXin (KEFLEX) 500 MG capsule 2 caps po bid x 7 days (Patient not taking: Reported on 10/01/2014) 28 capsule 0 Completed Course at Unknown time  . clindamycin (CLEOCIN) 300 MG capsule Take 1 capsule (300 mg total) by mouth 4 (four)  times daily. X 7 days (Patient not taking: Reported on 08/06/2015) 40 capsule 0 Completed Course at Unknown time  . oxyCODONE-acetaminophen (PERCOCET) 5-325 MG per tablet Take 2 tablets by mouth every 4 (four) hours as needed. (Patient not taking: Reported on 10/01/2014) 20 tablet 0 Completed Course at Unknown time  . sulfamethoxazole-trimethoprim (BACTRIM DS) 800-160 MG per tablet Take 1 tablet by mouth 2 (two) times daily. (Patient not taking: Reported on 10/01/2014) 20 tablet 0 Completed Course at Unknown time   Scheduled:  Scheduled:  . cefTRIAXone (ROCEPHIN)  IV  2 g Intravenous Q24H  . enoxaparin (LOVENOX) injection  40 mg Subcutaneous Q24H   Infusion[s]: Infusions:   Antibiotic[s]: Anti-infectives    Start     Dose/Rate Route Frequency Ordered Stop   08/06/15 1530  cefTRIAXone (ROCEPHIN) 2 g in dextrose 5 % 50 mL IVPB     2 g 100 mL/hr over 30 Minutes Intravenous Every 24 hours 08/06/15 1455     08/06/15 1315  cefTRIAXone (ROCEPHIN) 1 g in dextrose 5 % 50 mL IVPB     1 g 100 mL/hr over 30 Minutes Intravenous  Once 08/06/15 1312 08/06/15 1403     Assessment:  29 y/o female with history of recurrent left lower extremity cellulitis who is admitted with swelling, erythema, fever, and chills of LLE following bumping it on a bed frame.  CTX and Vancomycin to  be started as per ID consult.  Pharmacy asked to dose Vancomycin for cellulitis.  Goal of Therapy:   Vancomycin trough level 10-15 mcg/ml  Plan:  1. Vancomycin 2000 mg IV load x 1 dose, then 2. Vancomycin 750 mg IV q 12 hours. 3. Continue Ceftriaxone as previously ordered, 2 gm IV q 24 hours. 4. Monitor renal function, WBC, fever curve, any cultures/sensitivities, Vancomycin levels as clinically indicated, length of therapy, and follow clinical progression.  Rithy Mandley, Colin BentonEarle,  Pharm.D,    08/06/2015   3:24 PM

## 2015-08-06 NOTE — ED Notes (Signed)
cellulitls in left leg- recurrent-- happened last year at this time. Left lower leg red/hot to touch.

## 2015-08-07 ENCOUNTER — Observation Stay (HOSPITAL_COMMUNITY)

## 2015-08-07 DIAGNOSIS — L03119 Cellulitis of unspecified part of limb: Secondary | ICD-10-CM | POA: Diagnosis present

## 2015-08-07 DIAGNOSIS — Z6839 Body mass index (BMI) 39.0-39.9, adult: Secondary | ICD-10-CM | POA: Diagnosis not present

## 2015-08-07 DIAGNOSIS — L02419 Cutaneous abscess of limb, unspecified: Secondary | ICD-10-CM | POA: Diagnosis present

## 2015-08-07 DIAGNOSIS — R7303 Prediabetes: Secondary | ICD-10-CM | POA: Diagnosis not present

## 2015-08-07 DIAGNOSIS — R7301 Impaired fasting glucose: Secondary | ICD-10-CM | POA: Diagnosis not present

## 2015-08-07 DIAGNOSIS — E669 Obesity, unspecified: Secondary | ICD-10-CM | POA: Diagnosis present

## 2015-08-07 DIAGNOSIS — B9689 Other specified bacterial agents as the cause of diseases classified elsewhere: Secondary | ICD-10-CM | POA: Diagnosis not present

## 2015-08-07 DIAGNOSIS — R7302 Impaired glucose tolerance (oral): Secondary | ICD-10-CM | POA: Diagnosis present

## 2015-08-07 DIAGNOSIS — R509 Fever, unspecified: Secondary | ICD-10-CM | POA: Diagnosis present

## 2015-08-07 DIAGNOSIS — L03116 Cellulitis of left lower limb: Secondary | ICD-10-CM | POA: Diagnosis present

## 2015-08-07 LAB — HEMOGLOBIN A1C
HEMOGLOBIN A1C: 5.9 % — AB (ref 4.8–5.6)
MEAN PLASMA GLUCOSE: 123 mg/dL

## 2015-08-07 NOTE — Progress Notes (Signed)
VASCULAR LAB PRELIMINARY  PRELIMINARY  PRELIMINARY  PRELIMINARY  Bilateral lower extremity venous duplex  completed.    Preliminary report:  Bilateral:  No evidence of DVT, superficial thrombosis, or Baker's Cys  The order questioned reflux.  Our study does not address reflux.  A reflux study can be ordered as an outpatient at [facility] or [facility] location.  Monquie Fulgham, RVT 08/07/2015, 1:52 PM

## 2015-08-07 NOTE — Care Management Note (Signed)
Case Management Note  Patient Details  Name: Annette MawMichelle Jones MRN: [id number] Date of Birth: 08-31-1986  Subjective/Objective:      Date: 08/07/15 Spoke with patient at the bedside.  Introduced self as Sports coachcase manager and explained role in discharge planning and how to be reached.  Verified patient lives in town, with mom and daughter.  Expressed potential need for no other DME.  Verified patient anticipates to go home with family, at time of discharge and will have part-time supervision by family at this time to best of their knowledge. Patient denied needing help with their medication.  Patient drives to MD appointments.  Verified patient has PCP name.   Plan: CM will continue to follow [facility].               Action/Plan:   Expected Discharge Date:                  Expected Discharge Plan:  Home/Self Care  In-House Referral:     Discharge planning Services  CM Consult  Post Acute Care Choice:    Choice offered to:     DME Arranged:    DME Agency:     HH Arranged:    HH Agency:     Status of Service:  In process, will continue to follow  Medicare Important Message Given:    Date Medicare IM Given:    Medicare IM give by:    Date Additional Medicare IM Given:    Additional Medicare Important Message give by:     If discussed at Long Length of Stay Meetings, dates discussed:    Additional Comments:  Leone Havenaylor, Latroya Ng [place], RN 08/07/2015, 2:48 PM

## 2015-08-07 NOTE — Progress Notes (Addendum)
Date: 08/07/2015  Patient name: Onetha Gaffey  Medical record number: [id number]  Date of birth: 1986/03/28   This patient's plan of care was discussed with the house staff. Please see their note for complete details. I concur with their findings. Without complaints this AM.  Feels that her leg is unchagned.  Filed Vitals:   08/07/15 0458  BP: 121/76  Pulse: 95  Temp: 99.3 F (37.4 C)  Resp: 18   Her leg is mostly unchanged on exam, there is a slight decrease in erythema.   Recent Results (from the past 2160 hour(s))  Comprehensive metabolic panel     Status: Abnormal   Collection Time: 08/06/15 11:19 AM  Result Value Ref Range   Sodium 133 (L) 135 - 145 mmol/L   Potassium 3.5 3.5 - 5.1 mmol/L   Chloride 103 101 - 111 mmol/L   CO2 21 (L) 22 - 32 mmol/L   Glucose, Bld 118 (H) 65 - 99 mg/dL   BUN 7 6 - 20 mg/dL   Creatinine, Ser 1.18 (H) 0.44 - 1.00 mg/dL   Calcium 8.9 8.9 - 10.3 mg/dL   Total Protein 7.0 6.5 - 8.1 g/dL   Albumin 3.6 3.5 - 5.0 g/dL   AST 21 15 - 41 U/L   ALT 17 14 - 54 U/L   Alkaline Phosphatase 56 38 - 126 U/L   Total Bilirubin 0.6 0.3 - 1.2 mg/dL   GFR calc non Af Amer >60 >60 mL/min   GFR calc Af Amer >60 >60 mL/min    Comment: (NOTE) The eGFR has been calculated using the CKD EPI equation. This calculation has not been validated in all clinical situations. eGFR's persistently <60 mL/min signify possible Chronic Kidney Disease.    Anion gap 9 5 - 15  CBC with Differential     Status: Abnormal   Collection Time: 08/06/15 11:19 AM  Result Value Ref Range   WBC 17.5 (H) 4.0 - 10.5 K/uL   RBC 4.22 3.87 - 5.11 MIL/uL   Hemoglobin 11.7 (L) 12.0 - 15.0 g/dL   HCT 35.2 (L) 36.0 - 46.0 %   MCV 83.4 78.0 - 100.0 fL   MCH 27.7 26.0 - 34.0 pg   MCHC 33.2 30.0 - 36.0 g/dL   RDW 13.1 11.5 - 15.5 %   Platelets 241 150 - 400 K/uL   Neutrophils Relative % 89 %   Neutro Abs 15.5 (H) 1.7 - 7.7 K/uL   Lymphocytes Relative 6 %   Lymphs Abs 1.1 0.7 - 4.0 K/uL     Monocytes Relative 5 %   Monocytes Absolute 0.9 0.1 - 1.0 K/uL   Eosinophils Relative 0 %   Eosinophils Absolute 0.0 0.0 - 0.7 K/uL   Basophils Relative 0 %   Basophils Absolute 0.0 0.0 - 0.1 K/uL  I-Stat CG4 Lactic Acid, ED  (not at [facility])     Status: None   Collection Time: 08/06/15 11:36 AM  Result Value Ref Range   Lactic Acid, Venous 1.14 0.5 - 2.0 mmol/L  Hemoglobin A1c     Status: Abnormal   Collection Time: 08/06/15  1:49 PM  Result Value Ref Range   Hgb A1c MFr Bld 5.9 (H) 4.8 - 5.6 %    Comment: (NOTE)         Pre-diabetes: 5.7 - 6.4         Diabetes: >6.4         Glycemic control for adults with diabetes: <7.0    Mean Plasma  Glucose 123 mg/dL    Comment: (NOTE) Performed At: [facility] [place], [place] [id number] Lindon Romp MD [id number]   I-Stat CG4 Lactic Acid, ED  (not at [facility])     Status: None   Collection Time: 08/06/15  2:03 PM  Result Value Ref Range   Lactic Acid, Venous 0.58 0.5 - 2.0 mmol/L  Urinalysis, Routine w reflex microscopic (not at [facility])     Status: Abnormal   Collection Time: 08/06/15  3:15 PM  Result Value Ref Range   Color, Urine YELLOW YELLOW   APPearance CLOUDY (A) CLEAR   Specific Gravity, Urine 1.016 1.005 - 1.030   pH 6.0 5.0 - 8.0   Glucose, UA NEGATIVE NEGATIVE mg/dL   Hgb urine dipstick LARGE (A) NEGATIVE   Bilirubin Urine NEGATIVE NEGATIVE   Ketones, ur 40 (A) NEGATIVE mg/dL   Protein, ur 30 (A) NEGATIVE mg/dL   Urobilinogen, UA 1.0 0.0 - 1.0 mg/dL   Nitrite NEGATIVE NEGATIVE   Leukocytes, UA SMALL (A) NEGATIVE  Urine culture     Status: None (Preliminary result)   Collection Time: 08/06/15  3:15 PM  Result Value Ref Range   Specimen Description URINE, CLEAN CATCH    Special Requests NONE    Culture TOO YOUNG TO READ    Report Status PENDING   Urine microscopic-add on     Status: Abnormal   Collection Time: 08/06/15  3:15 PM  Result Value Ref Range   Squamous Epithelial / LPF MANY (A)  RARE   WBC, UA 3-6 <3 WBC/hpf   RBC / HPF 7-10 <3 RBC/hpf   Bacteria, UA MANY (A) RARE  Urine rapid drug screen (hosp performed)     Status: None   Collection Time: 08/06/15  3:16 PM  Result Value Ref Range   Opiates NONE DETECTED NONE DETECTED   Cocaine NONE DETECTED NONE DETECTED   Benzodiazepines NONE DETECTED NONE DETECTED   Amphetamines NONE DETECTED NONE DETECTED   Tetrahydrocannabinol NONE DETECTED NONE DETECTED   Barbiturates NONE DETECTED NONE DETECTED    Comment:        DRUG SCREEN FOR MEDICAL PURPOSES ONLY.  IF CONFIRMATION IS NEEDED FOR ANY PURPOSE, NOTIFY LAB WITHIN 5 DAYS.        LOWEST DETECTABLE LIMITS FOR URINE DRUG SCREEN Drug Class       Cutoff (ng/mL) Amphetamine      1000 Barbiturate      200 Benzodiazepine   778 Tricyclics       242 Opiates          300 Cocaine          300 THC              50    A/P 1. Cellulitis Slightly improved Will continue current anbx F/u bcx Consider prophylactic PEN G  2. Pre-DM encourage diet, exercise, wt loss  3. Health Maintaince Would check HIV ab  Campbell Riches, MD 08/07/2015, 11:15 AM

## 2015-08-07 NOTE — Progress Notes (Signed)
Subjective: No acute events overnight. Left leg is mildly painful and overall unchanged from last night. She has kept this slightly elevated but only had one pillow available. Fevers improved with no more chills today.  Objective: Vital signs in last 24 hours: Filed Vitals:   08/06/15 1400 08/06/15 1457 08/06/15 2104 08/07/15 0458  BP: 121/85 109/63 111/58 121/76  Pulse: 93 88 102 95  Temp:  99.3 F (37.4 C) 100.4 F (38 C) 99.3 F (37.4 C)  TempSrc:  Oral Oral Oral  Resp:  16 16 18   Height:      Weight:      SpO2: 97% 100% 99% 100%   Weight change:  No intake or output data in the 24 hours ending 08/07/15 1151   GENERAL- alert, co-operative, NAD ABDOMEN- Soft, nontender, no guarding or rebound, normoactive bowel sounds present NEURO- Sensation intact- globally EXTREMITIES- pulse 2+, symmetric, LLE 0.5x0.5cm wound at one third distance from knee to ankle with uniform erythema surrounding the site and inferior to it including entire foot, movement and sensation intact PSYCH- Normal mood and affect, appropriate thought content and speech.  Lab Results: Basic Metabolic Panel:  Recent Labs Lab 08/06/15 1119  NA 133*  K 3.5  CL 103  CO2 21*  GLUCOSE 118*  BUN 7  CREATININE 1.18*  CALCIUM 8.9   Liver Function Tests:  Recent Labs Lab 08/06/15 1119  AST 21  ALT 17  ALKPHOS 56  BILITOT 0.6  PROT 7.0  ALBUMIN 3.6   No results for input(s): LIPASE, AMYLASE in the last 168 hours. No results for input(s): AMMONIA in the last 168 hours. CBC:  Recent Labs Lab 08/06/15 1119  WBC 17.5*  NEUTROABS 15.5*  HGB 11.7*  HCT 35.2*  MCV 83.4  PLT 241   Cardiac Enzymes: No results for input(s): CKTOTAL, CKMB, CKMBINDEX, TROPONINI in the last 168 hours. BNP: No results for input(s): PROBNP in the last 168 hours. D-Dimer: No results for input(s): DDIMER in the last 168 hours. CBG: No results for input(s): GLUCAP in the last 168 hours. Hemoglobin A1C:  Recent  Labs Lab 08/06/15 1349  HGBA1C 5.9*   Fasting Lipid Panel: No results for input(s): CHOL, HDL, LDLCALC, TRIG, CHOLHDL, LDLDIRECT in the last 168 hours. Thyroid Function Tests: No results for input(s): TSH, T4TOTAL, FREET4, T3FREE, THYROIDAB in the last 168 hours. Coagulation: No results for input(s): LABPROT, INR in the last 168 hours. Anemia Panel: No results for input(s): VITAMINB12, FOLATE, FERRITIN, TIBC, IRON, RETICCTPCT in the last 168 hours. Urine Drug Screen: Drugs of Abuse     Component Value Date/Time   LABOPIA NONE DETECTED 08/06/2015 1516   COCAINSCRNUR NONE DETECTED 08/06/2015 1516   LABBENZ NONE DETECTED 08/06/2015 1516   AMPHETMU NONE DETECTED 08/06/2015 1516   THCU NONE DETECTED 08/06/2015 1516   LABBARB NONE DETECTED 08/06/2015 1516    Alcohol Level: No results for input(s): ETH in the last 168 hours. Urinalysis:  Recent Labs Lab 08/06/15 1515  COLORURINE YELLOW  LABSPEC 1.016  PHURINE 6.0  GLUCOSEU NEGATIVE  HGBUR LARGE*  BILIRUBINUR NEGATIVE  KETONESUR 40*  PROTEINUR 30*  UROBILINOGEN 1.0  NITRITE NEGATIVE  LEUKOCYTESUR SMALL*    Micro Results: Recent Results (from the past 240 hour(s))  Urine culture     Status: None (Preliminary result)   Collection Time: 08/06/15  3:15 PM  Result Value Ref Range Status   Specimen Description URINE, CLEAN CATCH  Final   Special Requests NONE  Final   Culture  TOO YOUNG TO READ  Final   Report Status PENDING  Incomplete   Studies/Results: Dg Chest 2 View  08/06/2015  CLINICAL DATA:  Fever. Shortness of breath on exertion. Left lower extremity cellulitis. EXAM: CHEST - 2 VIEW COMPARISON:  None. FINDINGS: The heart size and mediastinal contours are within normal limits. Both lungs are clear. The visualized skeletal structures are unremarkable. IMPRESSION: No active disease. Electronically Signed   By: Marin Roberts M.D.   On: 08/06/2015 11:50   Medications: I have reviewed the patient's current  medications. Scheduled Meds: . cefTRIAXone (ROCEPHIN)  IV  2 g Intravenous Q24H  . enoxaparin (LOVENOX) injection  40 mg Subcutaneous Q24H  . vancomycin (VANCOCIN) 750 mg IVPB  750 mg Intravenous Q12H   Continuous Infusions:  PRN Meds:.acetaminophen Assessment/Plan: Cellulitis of left lower extremity: No significant change in inflammation today. Consistent with cellulitis response to first day of therapy. Will continue broad IV coverage and F/U cultures. -F/U Doppler US for LEDVT -Start ceftriaxone 2g IV and vancomycin dosing per pharmacy consult -F/U blood Cx -Acetaminophen  q6hrs PRN  Impaired fasting glucose: Unchanged. Probably not a primary contributor at this time, but she would benefit from weight loss or dietary control to avoid future complications.  Diet: Carb modified DVT ppx: [place] enoxaparin FULL CODE  Dispo: Disposition is deferred at this time, awaiting improvement of current medical problems. Anticipated discharge in approximately 1-2 day(s).   The patient does not have a current PCP (No Pcp Per Patient) and does not know need an [facility] hospital follow-up appointment after discharge.  The patient does not know have transportation limitations that hinder transportation to clinic appointments.    Fuller Plan, MD 08/07/2015, 11:51 AM

## 2015-08-07 NOTE — Progress Notes (Signed)
Subjective:  Annette Jones is comfortable. She has felt no fevers or chills and has been ambulating about the unit. Her foot still feels hot and swollen, and she understands that she will likely require 2 days before we can potentially narrow her abx or transition her to oral medications.  Objective: Vital signs in last 24 hours: Filed Vitals:   08/06/15 1400 08/06/15 1457 08/06/15 2104 08/07/15 0458  BP: 121/85 109/63 111/58 121/76  Pulse: 93 88 102 95  Temp:  99.3 F (37.4 C) 100.4 F (38 C) 99.3 F (37.4 C)  TempSrc:  Oral Oral Oral  Resp:  Height:      Weight:      SpO2: 97% 100% 99% 100%   General: Comfortable-appearing, obese woman, lying in bed with her foot propped up on a folded pillow CV: Regular rate and rhythm, no murmurs/rubs/gallops Pulm: No increased work of breathing, no wheezes or crackles appreciated Abd: Soft and non-tender to palpation. BS+ Extremities: warm and well-perfused. Pulses 2+. Unchanged warm, erythematous area on LLE starting at the top third of the anterior calf and extending down the extremity to include the entire foot. No exudate or purulence, tight with some pedal edema.  Unchanged wound at top of erythematous patch with no exudate or bleeding, about 5 mm by 5 mm in size. Neuro: Sensation grossly in tact, A&O X 3  HbA1C 5.9 UDS negative UA with many bacteria, large Hb, small LE, Nitrite negative Blood Cx pending  Urine Cx pending HIV pending  Assessment/Plan:  Annette Jones is a 29 year old female with pre-diabetes and recurrent cellulitis who presented on 11/8 with 2 days of leg swelling and erythema, consistent with cellulitis. She was started on IV Vancomycin and Ceftriaxone in the ED, and we are awaiting blood culture results to narrow coverage. She has had 3 episodes of cellulitis in the past 2 years at the same region, so we are obtaining Dopplers to assess her venous patency in the LLE. Cliniaclly, she is improving, with decreased  temperature and no chills.  1. LLE Cellulitis: Minorly better since yesterday, although with very little receding. Minor fever to 38 briefly overnight, afebrile this morning. Improvement from 39.5 in ED. Would consider switching to oral Cephalexin or Clindamycin if cultures negative and clinical improvement seen by discharge. Evaluation for underlying venous stasis (risk factors including pregnancy, obesity) will be pursued here, but could also be completed outpatient. Unlikely DVT secondary to Nexplanon, as is progestin-only birth control.   -   Vanc dosing per pharmacy -   IV Ceftriaxone 2g -   F/u blood cx, urine cx -   Venous Dopplers  2. Health Maintenance: If she were to become diabetic, will only worsen her healing and ability to fight off such recurrent cases of cellulitis. Encourage diet and exercise outpatient to improve long-term risk factors. No history of GDM (normal 1 and 3h fasting glucose) -   Repeat A1C 5.9%, still in prediabetes range -   Consider starting Metformin outpatient, has benefit in pre-diabetic patients. Has not taken oral agents in the past.  Diet: Carb modified DVT ppx: [place] enoxaparin Code status: Full Dispo: Anticipated d/c in 2-3 days, pending improvement of current medical problems.  This is a Psychologist, occupational Note.  The care of the patient was discussed with Dr. Dimple Casey, and the assessment and plan formulated with their assistance.  Please see their attached note for official documentation of the daily encounter.     Bertha Stakes, Med  Student 08/07/2015, 11:25 AM

## 2015-08-08 DIAGNOSIS — L03116 Cellulitis of left lower limb: Principal | ICD-10-CM

## 2015-08-08 DIAGNOSIS — R7301 Impaired fasting glucose: Secondary | ICD-10-CM

## 2015-08-08 DIAGNOSIS — B9689 Other specified bacterial agents as the cause of diseases classified elsewhere: Secondary | ICD-10-CM

## 2015-08-08 LAB — BASIC METABOLIC PANEL
Anion gap: 7 (ref 5–15)
CHLORIDE: 103 mmol/L (ref 101–111)
CO2: 25 mmol/L (ref 22–32)
CREATININE: 1.04 mg/dL — AB (ref 0.44–1.00)
Calcium: 8.5 mg/dL — ABNORMAL LOW (ref 8.9–10.3)
GFR calc Af Amer: >60 mL/min (ref >60)
GLUCOSE: 106 mg/dL — AB (ref 65–99)
Potassium: 3.5 mmol/L (ref 3.5–5.1)
SODIUM: 135 mmol/L (ref 135–145)

## 2015-08-08 LAB — HIV ANTIBODY (ROUTINE TESTING W REFLEX): HIV SCREEN 4TH GENERATION: NONREACTIVE

## 2015-08-08 LAB — CBC
HCT: 32.6 % — ABNORMAL LOW (ref 36.0–46.0)
Hemoglobin: 10.8 g/dL — ABNORMAL LOW (ref 12.0–15.0)
MCH: 27.9 pg (ref 26.0–34.0)
MCHC: 33.1 g/dL (ref 30.0–36.0)
MCV: 84.2 fL (ref 78.0–100.0)
PLATELETS: 210 10*3/uL (ref 150–400)
RBC: 3.87 MIL/uL (ref 3.87–5.11)
RDW: 13.3 % (ref 11.5–15.5)
WBC: 6.8 10*3/uL (ref 4.0–10.5)

## 2015-08-08 LAB — URINE CULTURE

## 2015-08-08 MED ORDER — CEPHALEXIN 500 MG PO CAPS: 500.0000 mg | ORAL_CAPSULE | Freq: Four times a day (QID) | ORAL | Status: DC

## 2015-08-08 MED ORDER — CEPHALEXIN 500 MG PO CAPS: 500.0000 mg | ORAL_CAPSULE | Freq: Four times a day (QID) | ORAL | Status: AC

## 2015-08-08 NOTE — Progress Notes (Signed)
Pt being discharged home via wheelchair with family. Pt alert and oriented x4. VSS. Pt c/o no pain at this time. No signs of respiratory distress. Education complete and care plans resolved. IV removed with catheter intact and pt tolerated well. No further issues at this time. Pt to follow up with PCP. Fumiko Cham R, RN 

## 2015-08-08 NOTE — Discharge Summary (Signed)
Name: Annette Jones MRN: [id number] DOB: Jun 30, 1986 29 y.o. PCP: No Pcp Per Patient  Date of Admission: 08/06/2015 11:25 AM Date of Discharge: 08/08/2015 Attending Physician: Ginnie SmartJeffrey C Hatcher, MD  Discharge Diagnosis: Principal Problem:   Cellulitis of left lower extremity Active Problems:   Pre-diabetes   Recurrent cellulitis of lower leg   Cellulitis and abscess of leg  Discharge Medications:   Medication List    STOP taking these medications        clindamycin 300 MG capsule  Commonly known as:  CLEOCIN     oxyCODONE-acetaminophen 5-325 MG tablet  Commonly known as:  PERCOCET     sulfamethoxazole-trimethoprim 800-160 MG per tablet  Commonly known as:  BACTRIM DS      TAKE these medications        acetaminophen 325 MG tablet  Commonly known as:  TYLENOL  Take 650 mg by mouth every 6 (six) hours as needed for mild pain.     cephALEXin 500 MG capsule  Commonly known as:  KEFLEX  Take 1 capsule (500 mg total) by mouth 4 (four) times daily.  Start taking on:  08/09/2015     IMPLANON 68 MG Impl implant  Generic drug:  etonogestrel  Inject 1 each into the skin once.        Disposition and follow-up:   [facility].  At the hospital follow up visit please address:  1.  Left leg cellulitis: She has recurrent cellulitis of the distal left leg for several years and 3 times since 2015. Recommend ambulatory referral to vascular clinic for venous insufficiency evaluation. If there is no vascular intervention recommended and she continues this recurrent cellulitis might be a candidate for daily penicillin V prophylaxis in the future.  2.  Impaired fasting blood glucose: HgbA1c 5.9%, no indication for pharmacotherapy but we do recommend she reduce her future risk with lifestyle changes. Would benefit to establish routine care with some PCP.  Follow-up Appointments:     Follow-up Information    Follow up with Annette NoseNathan Boswell, MD On 08/15/2015.   Specialty:  Internal Medicine   Why:  @3 :15pm for hospital follow up   Contact information:   [address] [place] [id number] [phone]       Discharge Instructions:   Consultations:    Procedures Performed:  Dg Chest 2 View  08/06/2015  CLINICAL DATA:  Fever. Shortness of breath on exertion. Left lower extremity cellulitis. EXAM: CHEST - 2 VIEW COMPARISON:  None. FINDINGS: The heart size and mediastinal contours are within normal limits. Both lungs are clear. The visualized skeletal structures are unremarkable. IMPRESSION: No active disease. Electronically Signed   By: Marin Robertshristopher  Mattern M.D.   On: 08/06/2015 11:50    Admission HPI: 29 y/o AAF presents to ED for left lower extremity cellulitis after hitting her leg on a bed frame. She developed fever and chills yesterday overnight which continued throughout today. She notes erythema, mild swelling, and mild pain that has not worsened on walking. She took Tylenol this morning with minimal relief. At the emergency department she was noted to be febrile and otherwise stable and started on ceftriaxone IV. Leukocytosis of 17.5 with 15.5 absolute neutrophils. Blood cultures were obtained. 2 view chest x-ray showed no acute findings.  Her history is significant for recurrent infections to her lower extremities for many years, including most recently October 2015 in January 2016. These have generally improved with oral  antibiotics without significant systemic symptoms. She also has history of multiple UTIs and was treated for chlamydia in 2009. She is recently evaluated for impaired fasting glucose with last hemoglobin A1c 5.9%. She was seen 2 days ago for corneal abrasion of the right eye discharged with eyedrops.  Hospital Course by problem list: Cellulitis of left lower extremity Presented to ED with fever of 103.1 and leukocytosis of 17.5. Left leg erythematous, swollen, tender  distal from the mid shin. Blood cultures were obtained and treatment started on vancomycin and ceftriaxone. Her fever resolved and WBC count down to 6.8 by hospital day 1. Doppler ultrasound for LEDVT was performed showing no thrombus. Blood cultures were negative at 48 hours although patient was clinically improving. Antibiotics were changed to Keflex and patient was discharged home to complete 7 days total treatment.  Discharge Vitals:   BP 107/60 mmHg  Pulse 82  Temp(Src) 98.4 F (36.9 C) (Oral)  Resp 16  Ht  (1.651 m)  Wt 107.049 kg (236 lb)  BMI 39.27 kg/m2  SpO2 100%  LMP 07/28/2015  Discharge Labs:  Results for orders placed or performed during the hospital encounter of 08/06/15 (from the past 24 hour(s))  Basic metabolic panel     Status: Abnormal   Collection Time: 08/08/15  6:48 AM  Result Value Ref Range   Sodium 135 135 - 145 mmol/L   Potassium 3.5 3.5 - 5.1 mmol/L   Chloride 103 101 - 111 mmol/L   CO2 25 22 - 32 mmol/L   Glucose, Bld 106 (H) 65 - 99 mg/dL   BUN <5 (L) 6 - 20 mg/dL   Creatinine, Ser 3.87 (H) 0.44 - 1.00 mg/dL   Calcium 8.5 (L) 8.9 - 10.3 mg/dL   GFR calc non Af Amer >60 >60 mL/min   GFR calc Af Amer >60 >60 mL/min   Anion gap 7 5 - 15  CBC     Status: Abnormal   Collection Time: 08/08/15  6:48 AM  Result Value Ref Range   WBC 6.8 4.0 - 10.5 K/uL   RBC 3.87 3.87 - 5.11 MIL/uL   Hemoglobin 10.8 (L) 12.0 - 15.0 g/dL   HCT 56.4 (L) 33.2 - 95.1 %   MCV 84.2 78.0 - 100.0 fL   MCH 27.9 26.0 - 34.0 pg   MCHC 33.1 30.0 - 36.0 g/dL   RDW 88.4 16.6 - 06.3 %   Platelets 210 150 - 400 K/uL    Signed: Fuller Plan, MD 08/10/2015, 7:26 AM

## 2015-08-08 NOTE — Progress Notes (Signed)
Subjective: No acute events overnight. Left leg erythema in the same distribution but swelling and tenderness are improving. She is keeping the leg elevated on 2 pillows while in bed. Continues to have no fever or chills.  Objective: Vital signs in last 24 hours: Filed Vitals:   08/07/15 0458 08/07/15 1320 08/07/15 2135 08/08/15 0534  BP: 121/76 110/85 101/59 104/74  Pulse: 95 86 97   Temp: 99.3 F (37.4 C) 98.9 F (37.2 C) 98.5 F (36.9 C) 98.6 F (37 C)  TempSrc: Oral Oral Oral Oral  Resp: Height:      Weight:      SpO2: 100% 96% 99% 99%   Weight change:   Intake/Output Summary (Last 24 hours) at 08/08/15 1113 Last data filed at 08/08/15 0909  Gross per 24 hour  Intake    360 ml  Output      0 ml  Net    360 ml     GENERAL- alert, co-operative, NAD ABDOMEN- Soft, nontender, no guarding or rebound, normoactive bowel sounds present NEURO- Sensation intact- globally EXTREMITIES- pulse 2+, symmetric, LLE 0.5x0.5cm wound at one third distance from knee to ankle with uniform erythema surrounding the site and inferior to it including entire foot, skin creases now visible on anterior shin in the superior portion of the indurated area PSYCH- Normal mood and affect, appropriate thought content and speech.  Lab Results: Basic Metabolic Panel:  Recent Labs Lab 08/06/15 1119 08/08/15 0648  NA 133* 135  K 3.5 3.5  CL 103 103  CO2 21* 25  GLUCOSE 118* 106*  BUN 7 <5*  CREATININE 1.18* 1.04*  CALCIUM 8.9 8.5*   Liver Function Tests:  Recent Labs Lab 08/06/15 1119  AST 21  ALT 17  ALKPHOS 56  BILITOT 0.6  PROT 7.0  ALBUMIN 3.6   No results for input(s): LIPASE, AMYLASE in the last 168 hours. No results for input(s): AMMONIA in the last 168 hours. CBC:  Recent Labs Lab 08/06/15 1119 08/08/15 0648  WBC 17.5* 6.8  NEUTROABS 15.5*  --   HGB 11.7* 10.8*  HCT 35.2* 32.6*  MCV 83.4 84.2  PLT 241 210   Cardiac Enzymes: No results for input(s):  CKTOTAL, CKMB, CKMBINDEX, TROPONINI in the last 168 hours. BNP: No results for input(s): PROBNP in the last 168 hours. D-Dimer: No results for input(s): DDIMER in the last 168 hours. CBG: No results for input(s): GLUCAP in the last 168 hours. Hemoglobin A1C:  Recent Labs Lab 08/06/15 1349  HGBA1C 5.9*   Fasting Lipid Panel: No results for input(s): CHOL, HDL, LDLCALC, TRIG, CHOLHDL, LDLDIRECT in the last 168 hours. Thyroid Function Tests: No results for input(s): TSH, T4TOTAL, FREET4, T3FREE, THYROIDAB in the last 168 hours. Coagulation: No results for input(s): LABPROT, INR in the last 168 hours. Anemia Panel: No results for input(s): VITAMINB12, FOLATE, FERRITIN, TIBC, IRON, RETICCTPCT in the last 168 hours. Urine Drug Screen: Drugs of Abuse     Component Value Date/Time   LABOPIA NONE DETECTED 08/06/2015 1516   COCAINSCRNUR NONE DETECTED 08/06/2015 1516   LABBENZ NONE DETECTED 08/06/2015 1516   AMPHETMU NONE DETECTED 08/06/2015 1516   THCU NONE DETECTED 08/06/2015 1516   LABBARB NONE DETECTED 08/06/2015 1516    Alcohol Level: No results for input(s): ETH in the last 168 hours. Urinalysis:  Recent Labs Lab 08/06/15 1515  COLORURINE YELLOW  LABSPEC 1.016  PHURINE 6.0  GLUCOSEU NEGATIVE  HGBUR LARGE*  BILIRUBINUR NEGATIVE  Lavenia Atlas  40*  PROTEINUR 30*  UROBILINOGEN 1.0  NITRITE NEGATIVE  LEUKOCYTESUR SMALL*    Micro Results: Recent Results (from the past 240 hour(s))  Culture, blood (routine x 2)     Status: None (Preliminary result)   Collection Time: 08/06/15 11:10 AM  Result Value Ref Range Status   Specimen Description BLOOD RIGHT ANTECUBITAL  Final   Special Requests BOTTLES DRAWN AEROBIC AND ANAEROBIC 5CC  Final   Culture NO GROWTH < 24 HOURS  Final   Report Status PENDING  Incomplete  Culture, blood (routine x 2)     Status: None (Preliminary result)   Collection Time: 08/06/15 11:19 AM  Result Value Ref Range Status   Specimen Description BLOOD  LEFT ANTECUBITAL  Final   Special Requests BOTTLES DRAWN AEROBIC AND ANAEROBIC 5CC  Final   Culture NO GROWTH < 24 HOURS  Final   Report Status PENDING  Incomplete  Urine culture     Status: None   Collection Time: 08/06/15  3:15 PM  Result Value Ref Range Status   Specimen Description URINE, CLEAN CATCH  Final   Special Requests NONE  Final   Culture MULTIPLE SPECIES PRESENT, SUGGEST RECOLLECTION  Final   Report Status 08/08/2015 FINAL  Final   Studies/Results: Dg Chest 2 View  08/06/2015  CLINICAL DATA:  Fever. Shortness of breath on exertion. Left lower extremity cellulitis. EXAM: CHEST - 2 VIEW COMPARISON:  None. FINDINGS: The heart size and mediastinal contours are within normal limits. Both lungs are clear. The visualized skeletal structures are unremarkable. IMPRESSION: No active disease. Electronically Signed   By: Marin Robertshristopher  Mattern M.D.   On: 08/06/2015 11:50   Medications: I have reviewed the patient's current medications. Scheduled Meds: . cefTRIAXone (ROCEPHIN)  IV  2 g Intravenous Q24H  . enoxaparin (LOVENOX) injection  40 mg Subcutaneous Q24H  . vancomycin (VANCOCIN) 750 mg IVPB  750 mg Intravenous Q12H   Continuous Infusions:  PRN Meds:.acetaminophen Assessment/Plan: Cellulitis of left lower extremity: Cultures still NGTD, unlikely to return positive. No venous thrombosis on ultrasound. She should have a workup for venous insufficiency as an outpatient follow up. In the meantime we may start a prophylactic dose Pen G for her recurrent cellulitis. -Stop IV ceftriaxone, vancomycin -Start Keflex 500mg  PO q6hrs x5days -Recommend compression stocking use, preferably titrate per outpt venous study. -F/U blood Cx -Acetaminophen 650mg  q6hrs PRN  Impaired fasting glucose: Unchanged.  Diet: Carb modified DVT ppx:  enoxaparin FULL CODE  Dispo: Disposition is deferred at this time, awaiting improvement of current medical problems. Anticipated discharge to home in  approximately 0-1 day(s).  The patient does not have a current PCP (No Pcp Per Patient) and does not know [facility] after discharge.  The patient does not know have transportation limitations that hinder transportation to clinic appointments.  LOS: 1 day   Fuller Planhristopher W Rice, MD 08/08/2015, 11:13 AM

## 2015-08-08 NOTE — Progress Notes (Signed)
Subjective: [name] was comfortable overnight and notes decreased toe tenderness on her left side. She is able to ambulate with decreased pain and had no fevers overnight.   Objective: Vital signs in last 24 hours: Filed Vitals:   08/07/15 0458 08/07/15 1320 08/07/15 2135 08/08/15 0534  BP: 121/76 110/85 101/59 104/74  Pulse: 95 86 97   Temp: 99.3 F (37.4 C) 98.9 F (37.2 C) 98.5 F (36.9 C) 98.6 F (37 C)  TempSrc: Oral Oral Oral Oral  Resp: Height:      Weight:      SpO2: 100% 96% 99% 99%   General: Comfortable-appearing, obese woman laying in bed with leg propped up on 2.5 pillows CV: Regular rate and rhythm Pulm: Clear to auscultation bilaterally, no increased WOB Abd: Non-tender to palpation, BS+, non-distended Extremities: Warm and well-perfused. Left mid-shin and inferiorly to the dorsum of the foot remains swollen and erythematous, with some laxity and wrinkled induration at the superior border of inflammation where skin used to be more tense. Still with pronounced pedal edema, heat, and rubor. Neuro: A&O X 3  Weight change:   Intake/Output Summary (Last 24 hours) at 08/08/15 1050 Last data filed at 08/08/15 0909  Gross per 24 hour  Intake    360 ml  Output      0 ml  Net    360 ml   Venous Doppler: neg for DVT, superficial thrombosis, or Baker's Cyst. Suggest outpatient f/u reflux studies, not available inpt CMP: 135 / 3.5 / 103 / 25 / <5 / 1.04 / 106\ CBC: 6.8 / 10.8 / 32.6 / 210 HIV neg Blood, Urine cultures: NGTD  Assessment/Plan:  Annette Jones is a 29 year old female with pre-diabetes and recurrent cellulitis who presented on 11/8 with 2 days of leg swelling and erythema, consistent with cellulitis. She is improving, with decreased edema, no further fevers, and markedly decreased WBCs (6.8 today down from 17.5 on admission), and we will transition to p.o. Cephalexin for discharge today. For recurrent LLE celluitis, we suggest Dopplers with  reflux outpatient and prophylaxis with low-dose Clindamycin or Penicillin.  1. LLE Cellulitis: Afebrile, decreased superior swelling, still with marked pedal edema. Will transition to Cephalexin p.o. for discharge and encourage elevation at home, as well as compression stockings once her infection has cleared. Given young age and recurrent cellulitis, will prescribe low-dose penicillin on discharge to prophylax against further episodes. Will refer for outpt reflux Doppler studies.  - d/c IV Vanc and Ceftriaxone -   Blood, urine cx NGTD -   Encourage compression stocking usage at home -   Discharge with po Cephalexin 500 mg q6h for 5 more days -   Discharge with prophylactic Penicillin 500 mg qday, reassess at f/u visit   2. Health Maintenance: If she were to become diabetic, will only worsen her healing and ability to fight off such recurrent cases of cellulitis. Encourage diet and exercise outpatient to improve long-term risk factors. No history of GDM (normal 1 and 3h fasting glucose) - Repeat A1C 5.9%, still in prediabetes range - Consider starting Metformin outpatient, has benefit in pre-diabetic patients. Has not taken oral agents in the past.  Diet: Carb modified DVT ppx: [place] enoxaparin Code status: Full Dispo: Anticipated d/c today, given clinical improvement.  This is a Psychologist, occupational Note.  The care of the patient was discussed with Dr. Dimple Jones, and the assessment and plan formulated with their assistance.  Please see their attached note for official  documentation of the daily encounter.   LOS: 1 day   Bertha StakesJennifer Meredeth Furber, Med Student 08/08/2015, 10:50 AM

## 2015-08-08 NOTE — Discharge Instructions (Signed)
Take your antibiotic Keflex (Cephalexin) 500mg  1 pill 4 times daily for 5 days starting tomorrow Friday 11/11.  Keep your leg elevated when resting to allow faster improvement in the swelling.  Please attend your follow up appointment with [facility] Kar[facility]well [facility] on 11/17 at 3:15pm. At that time please discuss if your symptoms have continued improving.  If you develop worsening swelling that spreads to more areas or you have a new fever, please do not wait until your appointment and return to the ED to be re-checked.

## 2015-08-11 LAB — CULTURE, BLOOD (ROUTINE X 2)
CULTURE: NO GROWTH
Culture: NO GROWTH

## 2015-08-15 ENCOUNTER — Ambulatory Visit (INDEPENDENT_AMBULATORY_CARE_PROVIDER_SITE_OTHER): Admitting: Internal Medicine

## 2015-08-15 ENCOUNTER — Encounter: Payer: Self-pay | Admitting: Internal Medicine

## 2015-08-15 VITALS — BP 128/81 | HR 86 | Temp 98.5°F | Ht 65.0 in | Wt 237.8 lb

## 2015-08-15 DIAGNOSIS — Z0000 Encounter for general adult medical examination without abnormal findings: Secondary | ICD-10-CM

## 2015-08-15 DIAGNOSIS — B9689 Other specified bacterial agents as the cause of diseases classified elsewhere: Secondary | ICD-10-CM

## 2015-08-15 DIAGNOSIS — Z789 Other specified health status: Secondary | ICD-10-CM

## 2015-08-15 DIAGNOSIS — R7303 Prediabetes: Secondary | ICD-10-CM | POA: Diagnosis not present

## 2015-08-15 DIAGNOSIS — E669 Obesity, unspecified: Secondary | ICD-10-CM | POA: Diagnosis not present

## 2015-08-15 DIAGNOSIS — Z6839 Body mass index (BMI) 39.0-39.9, adult: Secondary | ICD-10-CM

## 2015-08-15 DIAGNOSIS — L03116 Cellulitis of left lower limb: Secondary | ICD-10-CM

## 2015-08-15 DIAGNOSIS — Z719 Counseling, unspecified: Secondary | ICD-10-CM

## 2015-08-15 DIAGNOSIS — L03119 Cellulitis of unspecified part of limb: Secondary | ICD-10-CM

## 2015-08-15 NOTE — Assessment & Plan Note (Addendum)
She was recently hospitalized from 11/8-11/10 with left lower extremity cellulitis presenting with fever of 103.1 and leukocytosis of 17.5 with left lower extremity edema, erythema and warmth. Blood cultures were negative and she was negative for DVT on doppler. Transitioned to Keflex for a 7 day course. She has had multiple infections in her left lower extremity with 3 infections since October 2015.  Today, she reports that she has finished the course of antibiotics and is feeling much better. Denies any leg pain, erythema or warmth. Still notes swelling in her left leg. Deneis any recent fevers or chills .Patient with no complaints today.  Plan Will refer to vascular clinic for venous insufficiency evaluation. If there is no vascular intervention recommended and she continues this recurrent cellulitis might be a candidate for daily penicillin V prophylaxis in the future.

## 2015-08-15 NOTE — Patient Instructions (Signed)
Thank you for coming in today, it was a pleasure to meet you.  -We will get you set up to have some studies done on your leg to try to find the cause of this cellulitis. We will call you when we get that scheduled. -We will also get you an appointment to see our nutritionist -Follow up in 3 months

## 2015-08-15 NOTE — Assessment & Plan Note (Addendum)
Patient with BMI of 39.7. Discussed diet and exercise at length today. Amenable to meeting with Norm Parcelonna Plyler for further education.

## 2015-08-16 DIAGNOSIS — Z0000 Encounter for general adult medical examination without abnormal findings: Secondary | ICD-10-CM | POA: Insufficient documentation

## 2015-08-16 DIAGNOSIS — Z789 Other specified health status: Secondary | ICD-10-CM | POA: Insufficient documentation

## 2015-08-16 NOTE — Assessment & Plan Note (Signed)
Reports last PAP smear was [facility]. Was told that the results were normal. Will attempt to get records today.  Declined flu shot today.

## 2015-08-16 NOTE — Assessment & Plan Note (Signed)
HgbA1c 5.9% during 11/8 hospitalization. No indication for pharmacotherapy but we do recommend she reduce her future risk with lifestyle changes. Discussed at length today. Amenable to meeting with Norm Parcelonna Plyler for further education.

## 2015-08-16 NOTE — Assessment & Plan Note (Signed)
Patient has Implanon in left upper extremity. Previously seen by OB-GYN. Reports it was placed 3 years ago this January. Will need follow up with OB-GYN for removal/replacement.

## 2015-08-16 NOTE — Progress Notes (Signed)
Patient ID: Annette Jones, female   DOB: 10-May-1986, 29 y.o.   MRN: [id number]   Subjective:   Patient ID: Annette Jones female   DOB: 07-21-1986 29 y.o.   MRN: [id number]  HPI: Ms.Annette Jones is a 29 y.o. female with a past medical history of recurrent left lower extremity cellulitis infections and obesity here today for hospital follow up and to establish care. She was recently hospitalized from 11/8-11/10 with left lower extremity cellulitis presenting with fever of 103.1 and leukocytosis of 17.5 with left lower extremity edema, erythema and warmth. Blood cultures were negative and she was negative for DVT on doppler. Transitioned to Keflex for a 7 day course. She has had multiple infections in her left lower extremity with 3 infections since October 2015.   Today, she reports that she has finished the course of antibiotics and is feeling much better. Denies any leg pain, erythema or warmth. Still notes swelling in her left leg. Deneis any recent fevers or chills .Patient with no complaints today.  Past Medical History  Diagnosis Date  . Urinary tract infection   . Infection     UTI X 1  . Chlamydia 2009    CHLAMYDIA  . Yeast infection     frequent  . Cellulitis of lower leg 2012    "left"  . Elevated hemoglobin A1c 04/14/2012    5.9 at [facility] interview--plan early glucola at 18 weeks.   . Borderline diabetes    Past Surgical History  Procedure Laterality Date  . Induced abortion     Current Outpatient Prescriptions  Medication Sig Dispense Refill  . acetaminophen (TYLENOL) 325 MG tablet Take 650 mg by mouth every 6 (six) hours as needed for mild pain.    Marland Kitchen etonogestrel (IMPLANON) 68 MG IMPL implant Inject 1 each into the skin once.     No current facility-administered medications for this visit.   Family History  Problem Relation Age of Onset  . Other Neg Hx   . Hypertension Mother   . Diabetes Maternal Grandmother   . Hypertension Maternal Grandmother   . Hypertension  Cousin    Social History   Social History  . Marital Status: Divorced    Spouse Name: N/A  . Number of Children: N/A  . Years of Education: 16   Occupational History  . CARE GIVER    Social History Main Topics  . Smoking status: Never Smoker   . Smokeless tobacco: Never Used  . Alcohol Use: 5.4 oz/week    7 Standard drinks or equivalent, 2 Shots of liquor per week     Comment: 08/06/2015 "I'll drink some a couple times/month"  . Drug Use: No  . Sexual Activity:    Partners: Male    Birth Control/ Protection: Implant   Other Topics Concern  . None   Social History Narrative   Review of Systems: Review of Systems  Constitutional: Negative for fever, chills and malaise/fatigue.  Eyes: Negative for blurred vision and double vision.  Respiratory: Negative for cough and shortness of breath.   Cardiovascular: Positive for leg swelling. Negative for chest pain.  Gastrointestinal: Negative for nausea, vomiting, abdominal pain and diarrhea.  Genitourinary: Negative for dysuria.  Musculoskeletal: Negative for myalgias, joint pain and falls.  Skin: Negative for rash.  Neurological: Negative for dizziness and headaches.   Objective:  Physical Exam: Filed Vitals:   08/15/15 1532  BP: 128/81  Pulse: 86  Temp: 98.5 F (36.9 C)  TempSrc: Oral  Height:  (  1.651 m)  Weight: 237 lb 12.8 oz (107.865 kg)  SpO2: 100%   PHYSICAL EXAM GENERAL- alert, co-operative, obese female in no distress. HEENT- Atraumatic, normocephalic, PERRL, EOMI, oral mucosa appears moist CARDIAC- RRR, no murmurs, rubs or gallops. RESP- Moving equal volumes of air, and clear to auscultation bilaterally, no wheezes or crackles. ABDOMEN- Soft, nontender, bowel sounds present. NEURO- No obvious Cr N abnormality. EXTREMITIES- pulse 2+, symmetric, mild pedal edema on the left. No erythema or warmth present. Non-tender to palpation. SKIN- Warm, dry, No rash or lesion. PSYCH- Normal mood and affect,  appropriate thought content and speech.  Assessment & Plan:   Case discussed with Dr. Josem KaufmannKlima. Please refer to Problem based charting for further details of today's visit.

## 2015-08-20 NOTE — Progress Notes (Signed)
I saw and evaluated the patient. I personally confirmed the key portions of Dr. Boswell's history and exam and reviewed pertinent patient test results. The assessment, diagnosis, and plan were formulated together and I agree with the documentation in the resident's note. 

## 2015-09-09 ENCOUNTER — Encounter: Payer: Self-pay | Admitting: Vascular Surgery

## 2015-09-09 ENCOUNTER — Other Ambulatory Visit: Payer: Self-pay | Admitting: *Deleted

## 2015-09-09 DIAGNOSIS — M7989 Other specified soft tissue disorders: Secondary | ICD-10-CM

## 2015-09-12 ENCOUNTER — Encounter: Payer: Self-pay | Admitting: General Practice

## 2015-09-12 ENCOUNTER — Encounter: Admitting: Dietician

## 2015-09-24 ENCOUNTER — Emergency Department (HOSPITAL_COMMUNITY)
Admission: EM | Admit: 2015-09-24 | Discharge: 2015-09-24 | Disposition: A | Discharge status: Home / Self Care | Attending: Emergency Medicine | Admitting: Emergency Medicine

## 2015-09-24 DIAGNOSIS — L03116 Cellulitis of left lower limb: Secondary | ICD-10-CM | POA: Diagnosis present

## 2015-09-24 DIAGNOSIS — Z87440 Personal history of urinary (tract) infections: Secondary | ICD-10-CM | POA: Diagnosis not present

## 2015-09-24 DIAGNOSIS — Z8619 Personal history of other infectious and parasitic diseases: Secondary | ICD-10-CM | POA: Diagnosis not present

## 2015-09-24 DIAGNOSIS — Z793 Long term (current) use of hormonal contraceptives: Secondary | ICD-10-CM | POA: Diagnosis not present

## 2015-09-24 LAB — CBC WITH DIFFERENTIAL/PLATELET
BASOS ABS: 0 10*3/uL (ref 0.0–0.1)
Basophils Relative: 0 %
Eosinophils Absolute: 0 10*3/uL (ref 0.0–0.7)
Eosinophils Relative: 0 %
HEMATOCRIT: 34.3 % — AB (ref 36.0–46.0)
HEMOGLOBIN: 11.2 g/dL — AB (ref 12.0–15.0)
LYMPHS PCT: 10 %
Lymphs Abs: 1.2 10*3/uL (ref 0.7–4.0)
MCH: 27.4 pg (ref 26.0–34.0)
MCHC: 32.7 g/dL (ref 30.0–36.0)
MCV: 83.9 fL (ref 78.0–100.0)
MONO ABS: 0.6 10*3/uL (ref 0.1–1.0)
MONOS PCT: 5 %
NEUTROS ABS: 10.6 10*3/uL — AB (ref 1.7–7.7)
NEUTROS PCT: 85 %
Platelets: 243 10*3/uL (ref 150–400)
RBC: 4.09 MIL/uL (ref 3.87–5.11)
RDW: 13.2 % (ref 11.5–15.5)
WBC: 12.3 10*3/uL — ABNORMAL HIGH (ref 4.0–10.5)

## 2015-09-24 LAB — BASIC METABOLIC PANEL
ANION GAP: 8 (ref 5–15)
BUN: 7 mg/dL (ref 6–20)
CALCIUM: 9.1 mg/dL (ref 8.9–10.3)
CHLORIDE: 106 mmol/L (ref 101–111)
CO2: 23 mmol/L (ref 22–32)
Creatinine, Ser: 1.17 mg/dL — ABNORMAL HIGH (ref 0.44–1.00)
GFR calc Af Amer: >60 mL/min (ref >60)
GFR calc non Af Amer: >60 mL/min (ref >60)
GLUCOSE: 129 mg/dL — AB (ref 65–99)
Potassium: 3.6 mmol/L (ref 3.5–5.1)
Sodium: 137 mmol/L (ref 135–145)

## 2015-09-24 MED ORDER — CEFAZOLIN SODIUM 1-5 GM-% IV SOLN
1.0000 g | Freq: Once | INTRAVENOUS | Status: AC
  Administered 2015-09-24: 1 g via INTRAVENOUS
  Filled 2015-09-24: qty 50

## 2015-09-24 MED ORDER — CEPHALEXIN 500 MG PO CAPS
500.0000 mg | ORAL_CAPSULE | Freq: Four times a day (QID) | ORAL | Status: DC
Start: 2015-09-24 — End: 2016-02-02

## 2015-09-24 MED ORDER — ACETAMINOPHEN 325 MG PO TABS
650.0000 mg | ORAL_TABLET | Freq: Once | ORAL | Status: AC
  Administered 2015-09-24: 650 mg via ORAL
  Filled 2015-09-24: qty 2

## 2015-09-24 NOTE — ED Notes (Signed)
Patient transported to X-ray 

## 2015-09-24 NOTE — ED Provider Notes (Signed)
CSN: [id number]     Arrival date & time 09/24/15  0901 History   First MD Initiated Contact with Patient 09/24/15 [phone]    Chief complaint: Cellulitis   HPI Pt has history of cellulitis in November on her left leg.  She was treated and the sx resolved.  She was doing well until yesterday.  SHe noticed pain in her toe and then she started having trouble chills and myalgias.  She noticed redness in her lower extremity.    No measured fever.  NO vomiting or diarrhea. Past Medical History  Diagnosis Date  . Urinary tract infection   . Infection     UTI X 1  . Chlamydia 2009    CHLAMYDIA  . Yeast infection     frequent  . Cellulitis of lower leg 2012    "left"  . Elevated hemoglobin A1c 04/14/2012    5.9 at [facility] interview--plan early glucola at 18 weeks.   . Borderline diabetes    Past Surgical History  Procedure Laterality Date  . Induced abortion     Family History  Problem Relation Age of Onset  . Other Neg Hx   . Hypertension Mother   . Diabetes Maternal Grandmother   . Hypertension Maternal Grandmother   . Hypertension Cousin    Social History  Substance Use Topics  . Smoking status: Never Smoker   . Smokeless tobacco: Never Used  . Alcohol Use: 5.4 oz/week    7 Standard drinks or equivalent, 2 Shots of liquor per week     Comment: 08/06/2015 "I'll drink some a couple times/month"   OB History    Gravida Para Term Preterm AB TAB SAB Ectopic Multiple Living   2 1 1  1  0    1     Review of Systems  All other systems reviewed and are negative.     Allergies  Percocet  Home Medications   Prior to Admission medications   Medication Sig Start Date End Date Taking? Authorizing Provider  acetaminophen (TYLENOL) 325 MG tablet Take 650 mg by mouth every 6 (six) hours as needed for mild pain.   Yes Historical Provider, MD  etonogestrel (IMPLANON) 68 MG IMPL implant Inject 1 each into the skin once.   Yes Historical Provider, MD  ibuprofen (ADVIL,MOTRIN) 200 MG tablet  Take 200 mg by mouth every 6 (six) hours as needed for moderate pain.   Yes Historical Provider, MD  cephALEXin (KEFLEX) 500 MG capsule Take 1 capsule (500 mg total) by mouth 4 (four) times daily. 09/24/15   Linwood DibblesJon Omair Dettmer, MD   BP 121/94 mmHg  Pulse 101  Temp(Src) 98.5 F (36.9 C) (Oral)  Resp 18  Ht 5\' 5"  (1.651 m)  Wt 106.595 kg  BMI 39.11 kg/m2  SpO2 98%  LMP 08/25/2015 Physical Exam  Constitutional: She appears well-developed and well-nourished. No distress.  HENT:  Head: Normocephalic and atraumatic.  Right Ear: External ear normal.  Left Ear: External ear normal.  Eyes: Conjunctivae are normal. Right eye exhibits no discharge. Left eye exhibits no discharge. No scleral icterus.  Neck: Neck supple. No tracheal deviation present.  Cardiovascular: Normal rate, regular rhythm and intact distal pulses.   Pulmonary/Chest: Effort normal and breath sounds normal. No stridor. No respiratory distress. She has no wheezes. She has no rales.  Abdominal: Soft. Bowel sounds are normal. She exhibits no distension. There is no tenderness. There is no rebound and no guarding.  Musculoskeletal: She exhibits edema and tenderness.  Erythema of  the left lower extremity below the knee approx 10 by 15 cm, no crepitus  Neurological: She is alert. She has normal strength. No cranial nerve deficit (no facial droop, extraocular movements intact, no slurred speech) or sensory deficit. She exhibits normal muscle tone. She displays no seizure activity. Coordination normal.  Skin: Skin is warm and dry. No rash noted.  Psychiatric: She has a normal mood and affect.  Nursing note and vitals reviewed.   ED Course  Procedures (including critical care time) Labs Review Labs Reviewed  CBC WITH DIFFERENTIAL/PLATELET - Abnormal; Notable for the following:    WBC 12.3 (*)    Hemoglobin 11.2 (*)    HCT 34.3 (*)    Neutro Abs 10.6 (*)    All other components within normal limits  BASIC METABOLIC PANEL - Abnormal;  Notable for the following:    Glucose, Bld 129 (*)    Creatinine, Ser 1.17 (*)    All other components within normal limits     MDM   Final diagnoses:  Cellulitis of left lower extremity    Patient has had several episodes of lower extremity cellulitis in the past. He's had negative Doppler studies.  The patient has plans to be evaluated for venous insufficiency.  Patient appears to have another episode of cellulitis. She is nontoxic, afebrile and does not appear likely ill. She is given a dose of Ancef IV and we will try outpatient treatment with Keflex which has been effective in the past. Follow up with her doctor in 2-3 days to be rechecked. Warning signs and precautions were discussed.    Linwood Dibbles, MD 09/24/15 1136

## 2015-09-24 NOTE — Discharge Instructions (Signed)

## 2015-09-24 NOTE — ED Notes (Signed)
Pt c/o L leg pain with 8cm x 5 cm redness to LLE that is warm to the touch recent hospitalization for similar symptoms last mth, pt c/o fever & chills, A&O x4, all skin intact no drainage noted

## 2015-10-04 ENCOUNTER — Encounter: Payer: Self-pay | Admitting: Vascular Surgery

## 2015-10-08 NOTE — Addendum Note (Signed)
Addended by: Neomia DearPOWERS, Jonah Nestle E on: 10/08/2015 06:06 PM   Modules accepted: Orders

## 2015-10-09 ENCOUNTER — Ambulatory Visit (HOSPITAL_COMMUNITY)
Admission: RE | Admit: 2015-10-09 | Discharge: 2015-10-09 | Disposition: A | Discharge status: Home / Self Care | Source: Ambulatory Visit | Attending: Vascular Surgery | Admitting: Vascular Surgery

## 2015-10-09 ENCOUNTER — Encounter: Payer: Self-pay | Admitting: Vascular Surgery

## 2015-10-09 ENCOUNTER — Ambulatory Visit (INDEPENDENT_AMBULATORY_CARE_PROVIDER_SITE_OTHER): Admitting: Vascular Surgery

## 2015-10-09 VITALS — BP 134/96 | Ht 65.0 in | Wt 236.1 lb

## 2015-10-09 DIAGNOSIS — M7989 Other specified soft tissue disorders: Secondary | ICD-10-CM | POA: Diagnosis not present

## 2015-10-09 DIAGNOSIS — I872 Venous insufficiency (chronic) (peripheral): Secondary | ICD-10-CM | POA: Diagnosis not present

## 2015-10-09 NOTE — Progress Notes (Signed)
[facility]  Patient name: Annette Jones MRN: [id number] DOB: [birth year] Sex: female  REASON FOR CONSULT: Recurrent swelling of left lower extremity. Referred by Dr. Valentino Nose.  HPI: Annette Jones is a 30 y.o. female, who is referred for evaluation of recurrent swelling in the left lower extremity with episodes of cellulitis. I have reviewed the records sent from Dr. Bonney Roussel office. The patient has been treated for recurrent cellulitis of the left lower extremity. She was hospitalized from 08/06/2015 2 08/08/2015 with fevers as high as 103.1. Blood cultures were negative at that time. She's had multiple admissions for cellulitis in the past. During her most recent admission, the patient did undergo a venous duplex scan on 08/08/2015 which showed no evidence of DVT involving either lower extremity.  She tells me that she's had chronic issues with swelling in the left lower extremity since high school. She denies any previous history of DVT or phlebitis. She denies any injury to the left lower extremity. She denies any abdominal or inguinal surgery or radiation therapy to suggest an etiology for lymphedema. 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 She does not wear compression stockings.  Past Medical History  Diagnosis Date  . Urinary tract infection   . Infection     UTI X 1  . Chlamydia 2009    CHLAMYDIA  . Yeast infection     frequent  . Cellulitis of lower leg 2012    "left"  . Elevated hemoglobin A1c 04/14/2012    5.9 at NOB interview--plan early glucola at 18 weeks.   . Borderline diabetes     Family History  Problem Relation Age of Onset  . Other Neg Hx   . Hypertension Mother   . Diabetes Maternal Grandmother   . Hypertension Maternal Grandmother   . Hypertension Cousin     SOCIAL HISTORY: Social History   Social History  . Marital Status: Divorced    Spouse Name: N/A  . Number of Children:  N/A  . Years of Education: 16   Occupational History  . CARE GIVER    Social History Main Topics  . Smoking status: Never Smoker   . Smokeless tobacco: Never Used  . Alcohol Use: 5.4 oz/week    7 Standard drinks or equivalent, 2 Shots of liquor per week     Comment: 08/06/2015 "I'll drink some a couple times/month"  . Drug Use: No  . Sexual Activity:    Partners: Male    Birth Control/ Protection: Implant   Other Topics Concern  . Not on file   Social History Narrative    Allergies  Allergen Reactions  . Percocet [Oxycodone-Acetaminophen] Itching    Current Outpatient Prescriptions  Medication Sig Dispense Refill  . acetaminophen (TYLENOL) 325 MG tablet Take 650 mg by mouth every 6 (six) hours as needed for mild pain.    Marland Kitchen etonogestrel (IMPLANON) 68 MG IMPL implant Inject 1 each into the skin once.    Marland Kitchen ibuprofen (ADVIL,MOTRIN) 200 MG tablet Take 200 mg by mouth every 6 (six) hours as needed for moderate pain.    . cephALEXin (KEFLEX) 500 MG capsule Take 1 capsule (500 mg total) by mouth 4 (four) times daily. (Patient not taking: Reported on 10/09/2015) 28 capsule 0   No current facility-administered medications for this visit.    REVIEW OF SYSTEMS:  [X]  denotes positive finding, [ ]  denotes negative finding Cardiac  Comments:  Chest pain or chest pressure:    Shortness of breath upon exertion:    Short of breath when lying flat:    Irregular heart rhythm:        Vascular    Pain in calf, thigh, or hip brought on by ambulation:    Pain in feet at night that wakes you up from your sleep:     Blood clot in your veins:    Leg swelling:         Pulmonary    Oxygen at home:    Productive cough:     Wheezing:         Neurologic    Sudden weakness in arms or legs:     Sudden numbness in arms or legs:     Sudden onset of difficulty speaking or slurred speech:    Temporary loss of vision in one eye:     Problems with dizziness:         Gastrointestinal    Blood  in stool:     Vomited blood:         Genitourinary    Burning when urinating:     Blood in urine:        Psychiatric    Major depression:         Hematologic    Bleeding problems:    Problems with blood clotting too easily:        Skin    Rashes or ulcers:        Constitutional    Fever or chills:      PHYSICAL EXAM: Filed Vitals:   10/09/15 1456  Height: 5\' 5"  (1.651 m)  Weight: 236 lb 1.6 oz (107.094 kg)    GENERAL: The patient is a well-nourished female, in no acute distress. The vital signs are documented above. CARDIAC: There is a regular rate and rhythm.  VASCULAR: I do not detect carotid bruits. She has palpable femoral pulses. She has bilateral lower extremity swelling which is mild. She has mild nonpitting edema.  PULMONARY: There is good air exchange bilaterally without wheezing or rales. ABDOMEN: Soft and non-tender with normal pitched bowel sounds.  MUSCULOSKELETAL: There are no major deformities or cyanosis. NEUROLOGIC: No focal weakness or paresthesias are detected. SKIN: There are no ulcers or rashes noted. PSYCHIATRIC: The patient has a normal affect.  DATA:   LOWER EXTREMITY VENOUS DUPLEX: I have independently interpreted her lower extremity venous duplex of the left lower extremity. There is no evidence of DVT in the left lower extremity. There is reflux in the common femoral vein on the left. There is no reflux in the great saphenous vein or small saphenous vein.  MEDICAL ISSUES:  CHRONIC LEFT LOWER EXTREMITY SWELLING: The patient has had chronic left lower extremity swelling since she was in high school. I believe that this most likely is related to some mild chronic venous insufficiency (based on her duplex results above), and also to lymphedema. I have explained however that the treatment for both of these is the same. We have discussed the importance of intermittent leg elevation in the proper positioning for this. In addition I have given her a  prescription for knee-high compression stockings with a gradient of 15-20 mmHg. I have encouraged her to avoid prolonged sitting and standing. I have encouraged her to ambulate as much as possible and to exercise. We also discussed water aerobics which is also helpful for people with lymphedema and chronic venous insufficiency.  Just to be  thorough, I have recommended a CT of the abdomen and pelvis with venous phase in order to evaluate her for May Thurner syndrome. If she does have compression of the iliac vein on the left then consideration could be given to venoplasty. Once I have seen her CT scan, I will call her with her results.   Her cell phone number is [phone].  HYPERTENSION: The patient's initial blood pressure today was elevated. We repeated this and this was still elevated. We have encouraged the patient to follow up with their primary care physician for management of their blood pressure.  Waverly Ferrari [facility] [place]: [phone]

## 2015-10-10 NOTE — Addendum Note (Signed)
Addended by: Adria DillELDRIDGE-LEWIS, Arbutus Nelligan L on: 10/10/2015 02:12 PM   Modules accepted: Orders

## 2015-10-10 NOTE — Addendum Note (Signed)
Addended by: Adria DillELDRIDGE-LEWIS, Aleksis Jiggetts L on: 10/10/2015 11:24 AM   Modules accepted: Orders

## 2015-10-16 ENCOUNTER — Ambulatory Visit
Admission: RE | Admit: 2015-10-16 | Discharge: 2015-10-16 | Disposition: A | Discharge status: Home / Self Care | Source: Ambulatory Visit | Attending: Vascular Surgery | Admitting: Vascular Surgery

## 2015-10-16 DIAGNOSIS — I872 Venous insufficiency (chronic) (peripheral): Secondary | ICD-10-CM

## 2016-02-02 ENCOUNTER — Encounter (HOSPITAL_COMMUNITY): Payer: Self-pay | Admitting: Emergency Medicine

## 2016-02-02 ENCOUNTER — Emergency Department (HOSPITAL_COMMUNITY)
Admission: EM | Admit: 2016-02-02 | Discharge: 2016-02-02 | Disposition: A | Discharge status: Home / Self Care | Attending: Emergency Medicine | Admitting: Emergency Medicine

## 2016-02-02 DIAGNOSIS — L03115 Cellulitis of right lower limb: Secondary | ICD-10-CM | POA: Diagnosis not present

## 2016-02-02 DIAGNOSIS — Z87440 Personal history of urinary (tract) infections: Secondary | ICD-10-CM | POA: Diagnosis not present

## 2016-02-02 DIAGNOSIS — Z8619 Personal history of other infectious and parasitic diseases: Secondary | ICD-10-CM | POA: Insufficient documentation

## 2016-02-02 DIAGNOSIS — M79604 Pain in right leg: Secondary | ICD-10-CM | POA: Diagnosis present

## 2016-02-02 MED ORDER — IBUPROFEN 400 MG PO TABS
600.0000 mg | ORAL_TABLET | Freq: Once | ORAL | Status: AC
  Administered 2016-02-02: 600 mg via ORAL
  Filled 2016-02-02: qty 1

## 2016-02-02 MED ORDER — CEPHALEXIN 500 MG PO CAPS: 500.0000 mg | ORAL_CAPSULE | Freq: Four times a day (QID) | ORAL | Status: DC

## 2016-02-02 MED ORDER — CEPHALEXIN 250 MG PO CAPS
500.0000 mg | ORAL_CAPSULE | Freq: Once | ORAL | Status: AC
  Administered 2016-02-02: 500 mg via ORAL
  Filled 2016-02-02: qty 2

## 2016-02-02 NOTE — ED Provider Notes (Signed)
CSN: [id number]     Arrival date & time 02/02/16  0907 History   First MD Initiated Contact with Patient 02/02/16 [phone]     Chief Complaint  Patient presents with  . Leg Pain     (Consider location/radiation/quality/duration/timing/severity/associated sxs/prior Treatment) HPI Patient presents with one day of right leg redness and tenderness. States that she started experiencing chills and subjective fevers yesterday morning. Noticed redness to the right lower extremity yesterday evening. It has progressed throughout the night. States she's had multiple episodes of leg cellulitis since she was in high school. Typically treated with Keflex with good effect. As he did ask surgeon in the past and diagnosed with venous insufficiency. Has had multiple Dopplers with no evidence of DVT. Past Medical History  Diagnosis Date  . Urinary tract infection   . Infection     UTI X 1  . Chlamydia 2009    CHLAMYDIA  . Yeast infection     frequent  . Cellulitis of lower leg 2012    "left"  . Elevated hemoglobin A1c 04/14/2012    5.9 at NOB interview--plan early glucola at 18 weeks.   . Borderline diabetes    Past Surgical History  Procedure Laterality Date  . Induced abortion     Family History  Problem Relation Age of Onset  . Other Neg Hx   . Hypertension Mother   . Diabetes Maternal Grandmother   . Hypertension Maternal Grandmother   . Hypertension Cousin    Social History  Substance Use Topics  . Smoking status: Never Smoker   . Smokeless tobacco: Never Used  . Alcohol Use: 5.4 oz/week    7 Standard drinks or equivalent, 2 Shots of liquor per week     Comment: 08/06/2015 "I'll drink some a couple times/month"   OB History    Gravida Para Term Preterm AB TAB SAB Ectopic Multiple Living   2 1 1  1  0    1     Review of Systems  Constitutional: Positive for fever and chills. Negative for fatigue.  Cardiovascular: Negative for leg swelling.  Gastrointestinal: Negative for nausea and  vomiting.  Musculoskeletal: Negative for myalgias and arthralgias.  Skin: Positive for color change.  Neurological: Negative for weakness and numbness.  All other systems reviewed and are negative.     Allergies  Percocet  Home Medications   Prior to Admission medications   Medication Sig Start Date End Date Taking? Authorizing Provider  etonogestrel (IMPLANON) 68 MG IMPL implant Inject 1 each into the skin continuous. Located in left arm   Yes Historical Provider, MD  acetaminophen (TYLENOL) 325 MG tablet Take 650 mg by mouth every 6 (six) hours as needed for mild pain.    Historical Provider, MD  cephALEXin (KEFLEX) 500 MG capsule Take 1 capsule (500 mg total) by mouth 4 (four) times daily. 02/02/16   Loren Raceravid Jaramie Bastos, MD  ibuprofen (ADVIL,MOTRIN) 200 MG tablet Take 200 mg by mouth every 6 (six) hours as needed for moderate pain.    Historical Provider, MD   BP 133/98 mmHg  Pulse 99  Temp(Src) 98.5 F (36.9 C) (Oral)  Resp 14  Ht 5\' 5"  (1.651 m)  Wt 234 lb (106.142 kg)  BMI 38.94 kg/m2  SpO2 99%  LMP 01/30/2016 (Exact Date) Physical Exam  Constitutional: She is oriented to person, place, and time. She appears well-developed and well-nourished. No distress.  HENT:  Head: Normocephalic and atraumatic.  Mouth/Throat: Oropharynx is clear and moist. No oropharyngeal exudate.  Eyes: EOM are normal. Pupils are equal, round, and reactive to light.  Neck: Normal range of motion. Neck supple.  Cardiovascular: Normal rate and regular rhythm.   Pulmonary/Chest: Effort normal and breath sounds normal. No respiratory distress. She has no wheezes. She has no rales. She exhibits no tenderness.  Abdominal: Soft. Bowel sounds are normal. She exhibits no distension and no mass. There is no tenderness. There is no rebound and no guarding.  Musculoskeletal: Normal range of motion. She exhibits tenderness. She exhibits no edema.  Patient with mild tenderness to palpation over the right pretibial  area. There is area of redness on the anterior right lower extremity. There is no appreciable lower extremity asymmetry or calf tenderness. Distal pulses are 2+.  Neurological: She is alert and oriented to person, place, and time.  Skin: Skin is warm and dry. No rash noted. There is erythema.  Mild Erythema and warmth in the pretibial area of the right lower extremity.  Psychiatric: She has a normal mood and affect. Her behavior is normal.  Nursing note and vitals reviewed.   ED Course  Procedures (including critical care time) Labs Review Labs Reviewed - No data to display  Imaging Review No results found. I have personally reviewed and evaluated these images and lab results as part of my medical decision-making.   EKG Interpretation None      MDM   Final diagnoses:  Cellulitis of right lower extremity    Patient sitting heart rate is 105. Increase is 115 during interview. Suspect some degree of anxiety. Patient is very well-appearing and in no distress. Patient presents with recurrent right lower extremity cellulitis. Typically treated with Keflex. Low suspicion for DVT. There is no lower extremity asymmetry or calf tenderness. Patient had multiple Dopplers in the past negative for DVT. Patient has been seen in the resident clinic in the past. Advise close follow-up with them or return to the emergency department in one day for wound reevaluation. The edges of the cellulitis were marked and she was given first dose of Keflex here in the emergency department. She understands the need to return immediately for any worsening redness, pain, fever or for any concerns.    Loren Racer, MD 02/02/16 1021

## 2016-02-02 NOTE — ED Notes (Signed)
Patient complains of pain to the lower right leg that started last night.  Large, warm, red patch on anterior lower right leg.  Patient states she has been treated for cellulitis multiple times in this same area.  Patient alert and oriented at this time.

## 2016-02-02 NOTE — Discharge Instructions (Signed)
Follow-up with the emergency department or with her primary physician in one day to have the cellulitis reevaluated. Return immediately to the emergency department should the redness dressed past the marked area. Take your medications as prescribed. You may take Tylenol or ibuprofen for fever and pain.  Cellulitis Cellulitis is an infection of the skin and the tissue beneath it. The infected area is usually red and tender. Cellulitis occurs most often in the arms and lower legs.  CAUSES  Cellulitis is caused by bacteria that enter the skin through cracks or cuts in the skin. The most common types of bacteria that cause cellulitis are staphylococci and streptococci. SIGNS AND SYMPTOMS   Redness and warmth.  Swelling.  Tenderness or pain.  Fever. DIAGNOSIS  Your health care provider can usually determine what is wrong based on a physical exam. Blood tests may also be done. TREATMENT  Treatment usually involves taking an antibiotic medicine. HOME CARE INSTRUCTIONS   Take your antibiotic medicine as directed by your health care provider. Finish the antibiotic even if you start to feel better.  Keep the infected arm or leg elevated to reduce swelling.  Apply a warm cloth to the affected area up to 4 times per day to relieve pain.  Take medicines only as directed by your health care provider.  Keep all follow-up visits as directed by your health care provider. SEEK MEDICAL CARE IF:   You notice red streaks coming from the infected area.  Your red area gets larger or turns dark in color.  Your bone or joint underneath the infected area becomes painful after the skin has healed.  Your infection returns in the same area or another area.  You notice a swollen bump in the infected area.  You develop new symptoms.  You have a fever. SEEK IMMEDIATE MEDICAL CARE IF:   You feel very sleepy.  You develop vomiting or diarrhea.  You have a general ill feeling (malaise) with muscle  aches and pains.   This information is not intended to replace advice given to you by your health care provider. Make sure you discuss any questions you have with your health care provider.   Document Released: 06/24/2005 Document Revised: 06/05/2015 Document Reviewed: 11/30/2011 Elsevier Interactive Patient Education [employer]2016 Elsevier Inc.

## 2016-07-01 ENCOUNTER — Other Ambulatory Visit: Payer: Self-pay | Admitting: Obstetrics and Gynecology

## 2016-07-13 DIAGNOSIS — L299 Pruritus, unspecified: Secondary | ICD-10-CM | POA: Insufficient documentation

## 2016-07-13 DIAGNOSIS — Z6841 Body Mass Index (BMI) 40.0 and over, adult: Secondary | ICD-10-CM

## 2017-01-12 IMAGING — DX DG CHEST 2V
2 series · 2 of 2 positions shown · non-contrast
Comparison: None.

CLINICAL DATA: Fever. Shortness of breath on exertion. Left lower
extremity cellulitis.

EXAM:
CHEST - 2 VIEW

[w chest pa]
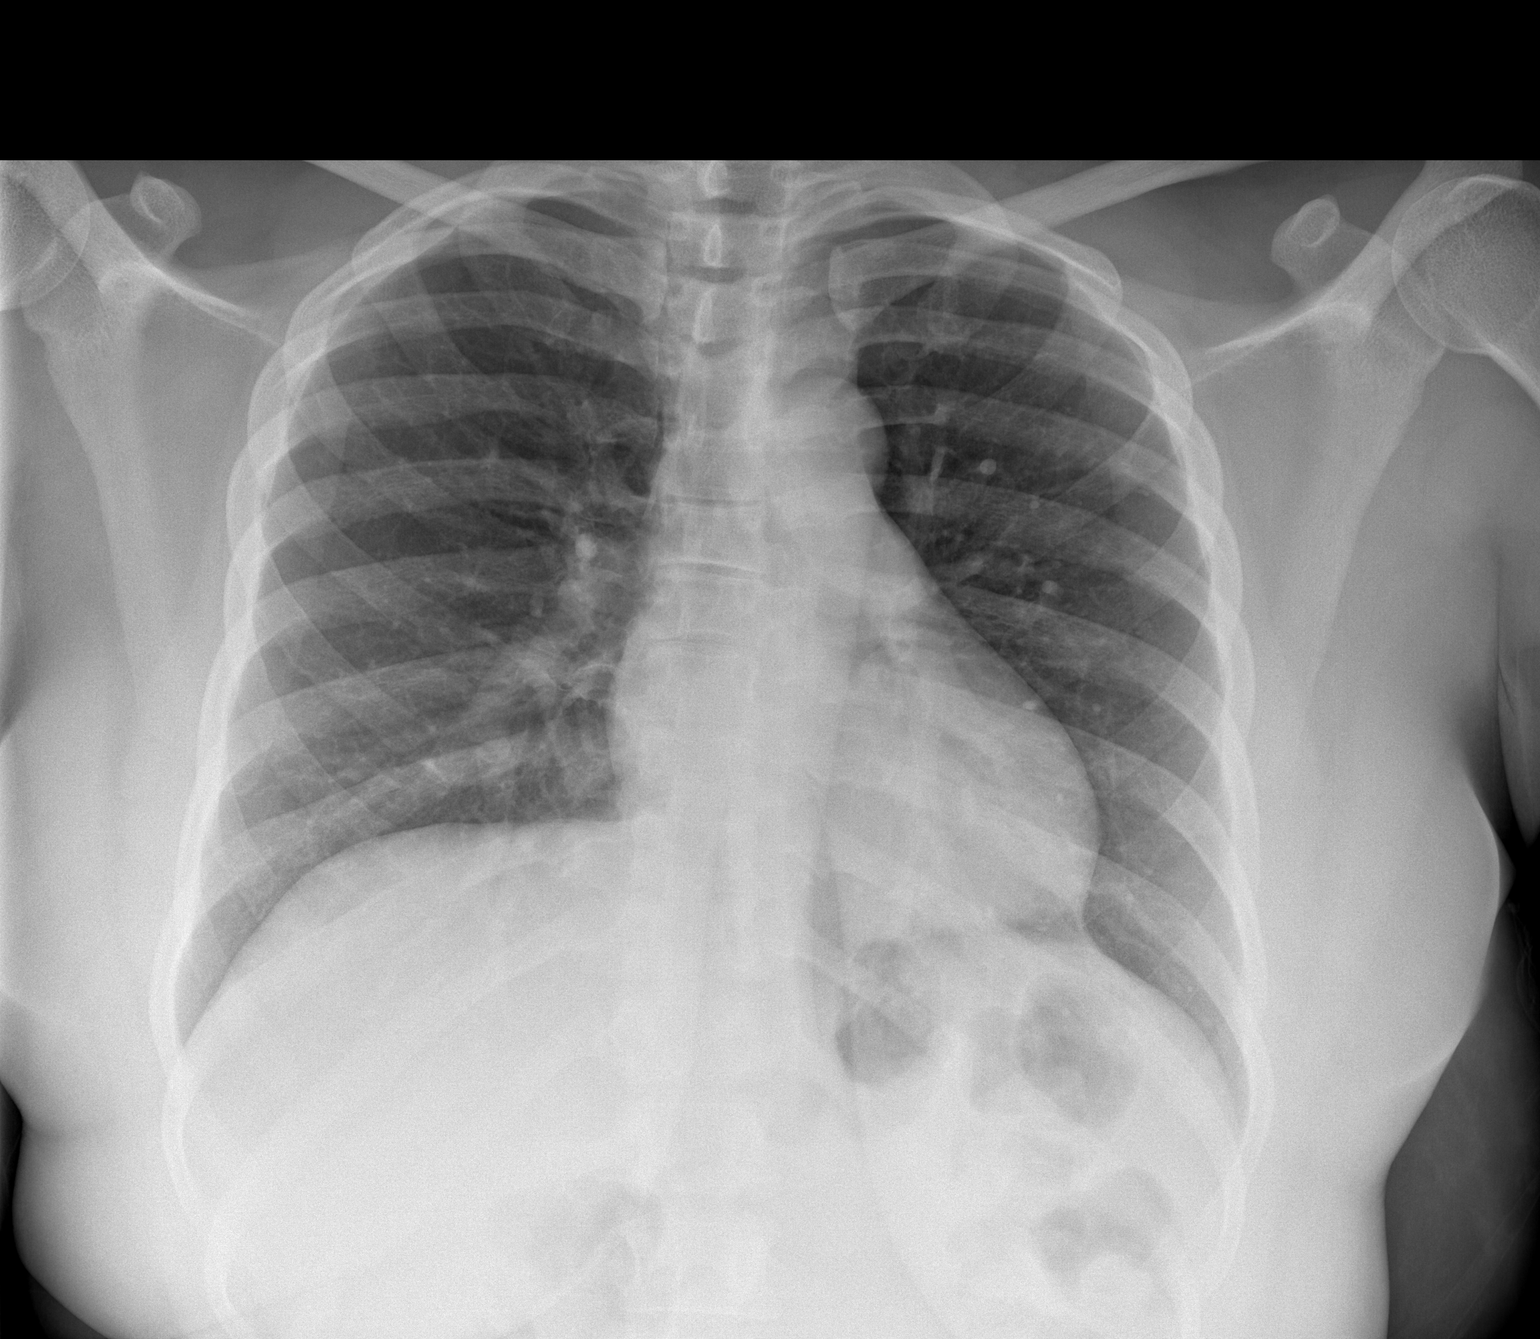

[w chest lat]
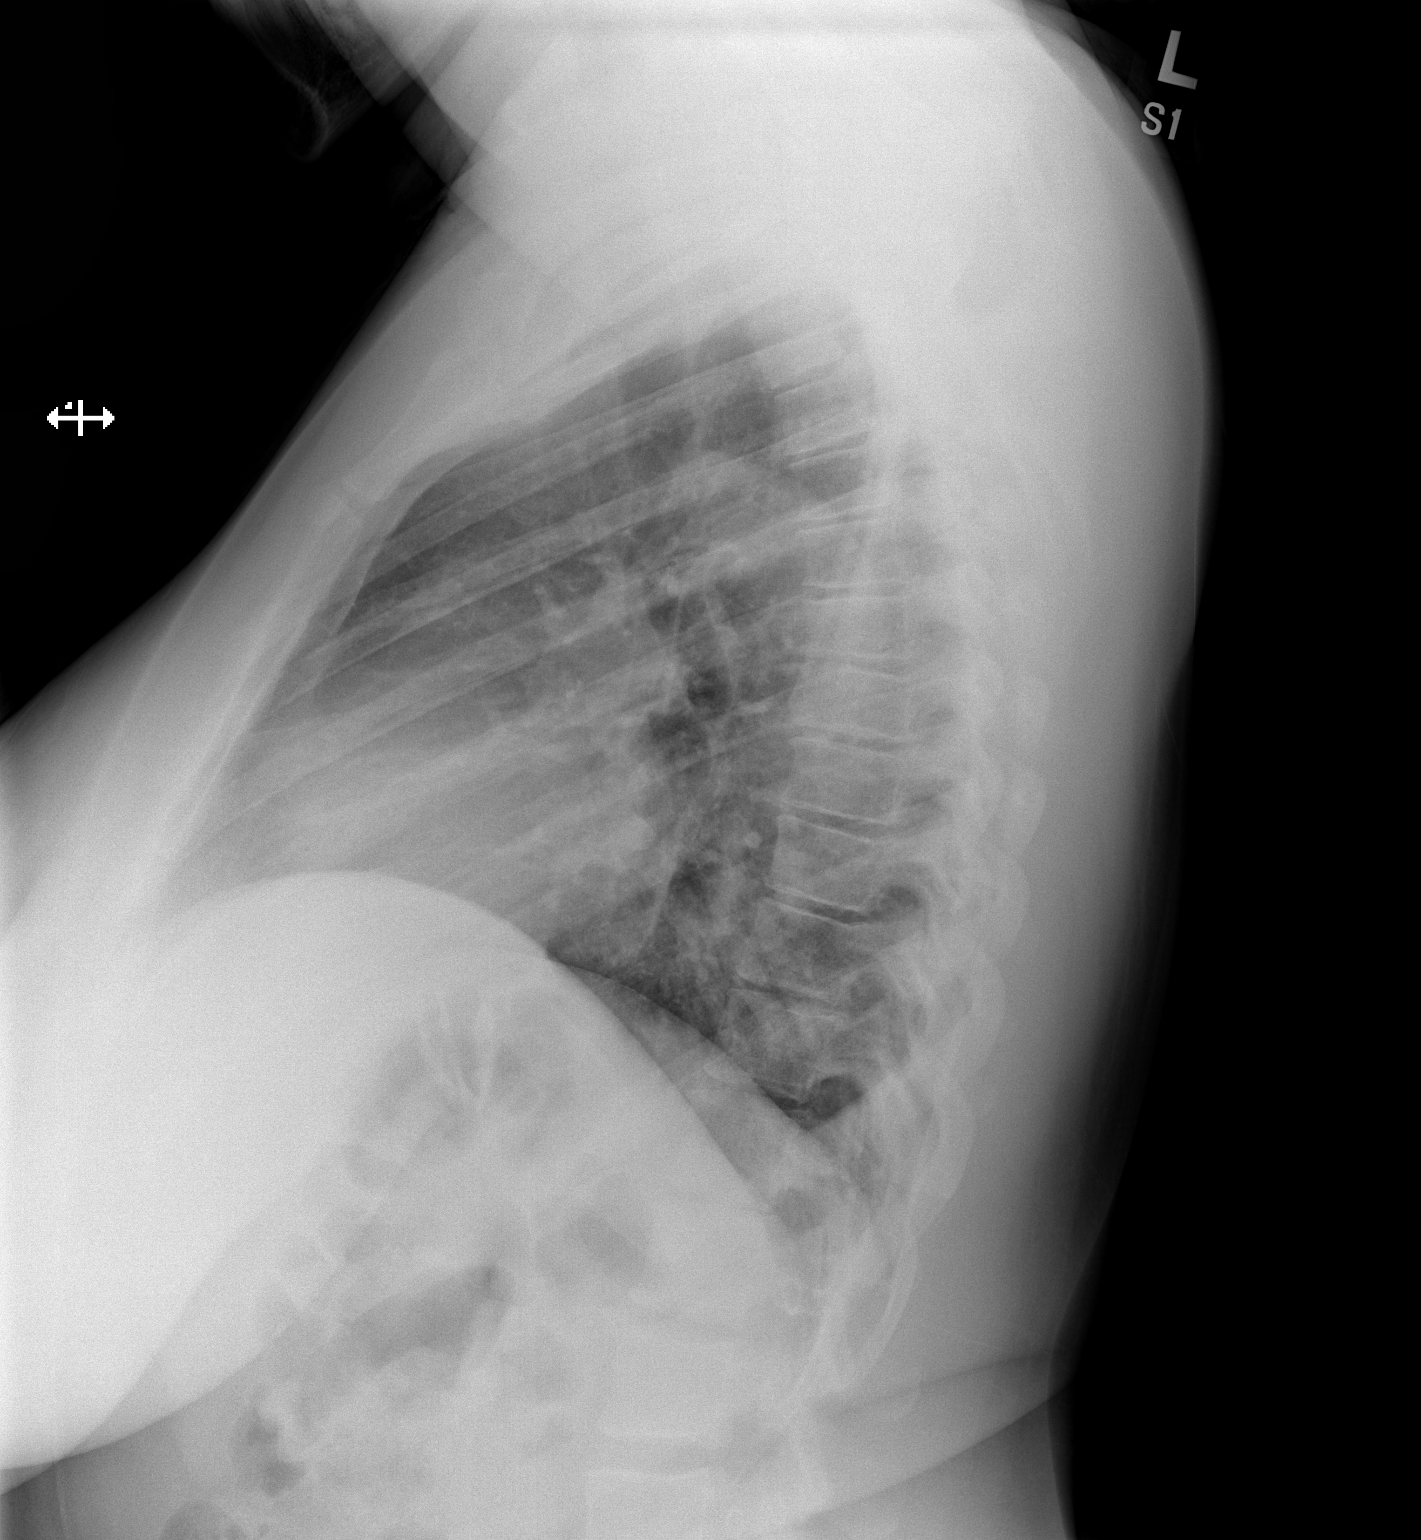

[2 of 2 positions shown; findings below may reference images not displayed]

FINDINGS: The heart size and mediastinal contours are within normal limits.
Both lungs are clear. The visualized skeletal structures are
unremarkable.
IMPRESSION: No active disease.

## 2017-06-15 ENCOUNTER — Ambulatory Visit (INDEPENDENT_AMBULATORY_CARE_PROVIDER_SITE_OTHER): Payer: BLUE CROSS/BLUE SHIELD | Admitting: Podiatry

## 2017-06-15 ENCOUNTER — Encounter: Payer: Self-pay | Admitting: Podiatry

## 2017-06-15 VITALS — BP 143/100 | HR 79 | Resp 16

## 2017-06-15 DIAGNOSIS — L603 Nail dystrophy: Secondary | ICD-10-CM | POA: Diagnosis not present

## 2017-06-15 NOTE — Progress Notes (Signed)
  Subjective:  Patient ID: Annette Jones, female    DOB: [birth year],  MRN: [id number] HPI Chief Complaint  Patient presents with  . Nail Problem    Hallux nail left and 3rd toenail right - thick, discolored x several months, bumped hallux nail and broke apart, not sore    31 y.o. female presents with the above complaint.   Possible mycotic nail third digit of the right foot and the hallux left. States that her nails are doing great but she was on prenatal vitamins.  Past Medical History:  Diagnosis Date  . Borderline diabetes   . Cellulitis of lower leg 2012   "left"  . Chlamydia 2009   CHLAMYDIA  . Elevated hemoglobin A1c 04/14/2012   5.9 at NOB interview--plan early glucola at 18 weeks.   . Infection    UTI X 1  . Urinary tract infection   . Yeast infection    frequent   Past Surgical History:  Procedure Laterality Date  . INDUCED ABORTION      Current Outpatient Prescriptions:  .  etonogestrel (IMPLANON) 68 MG IMPL implant, Inject 1 each into the skin continuous. Located in left arm, Disp: , Rfl:   Allergies  Allergen Reactions  . Percocet [Oxycodone-Acetaminophen] Itching   Review of Systems  All other systems reviewed and are negative.  Objective:   Vitals:   06/15/17 0826  BP: (!) 143/100  Pulse: 79  Resp: 16   General AA&O x3. Patient is alert and oriented.  Vascular Dorsalis pedis and posterior tibial pulses 2/4 bilat. Brisk capillary refill to all digits. Pedal hair present.  Neurologic Epicritic sensation grossly intact.  Dermatologic No open lesions. Interspaces clear of maceration. Nails well groomed and normal in appearance.No dystrophy cannot rule out onychomycosis third digital nail plate right foot. Hallux left does demonstrate distal onycholysis with fragile friable breakable nail subungual debris and malodor.   Orthopedic: MMT 5/5 in dorsiflexion, plantarflexion, inversion, and eversion. Normal joint ROM without pain or crepitus.    Radiographs:  None taken today.  Assessment & Plan:   Assessment: Nail dystrophy third digit right foot hallux left foot cannot rule out onychomycosis.  Plan: Samples of the lesion/nails were taken today to be sent for pathologic evaluation. Follow up with her once the results have returned.     Max T. Scott, [place]

## 2017-07-13 ENCOUNTER — Ambulatory Visit (INDEPENDENT_AMBULATORY_CARE_PROVIDER_SITE_OTHER): Payer: BLUE CROSS/BLUE SHIELD | Admitting: Podiatry

## 2017-07-13 DIAGNOSIS — Z79899 Other long term (current) drug therapy: Secondary | ICD-10-CM

## 2017-07-13 DIAGNOSIS — L603 Nail dystrophy: Secondary | ICD-10-CM | POA: Diagnosis not present

## 2017-07-13 LAB — HEPATIC FUNCTION PANEL
AG Ratio: 1.3 (calc) (ref 1.0–2.5)
ALT: 12 U/L (ref 6–29)
AST: 15 U/L (ref 10–30)
Albumin: 4 g/dL (ref 3.6–5.1)
Alkaline phosphatase (APISO): 63 U/L (ref 33–115)
Bilirubin, Direct: 0.1 mg/dL (ref 0.0–0.2)
Globulin: 3.1 g/dL (calc) (ref 1.9–3.7)
Indirect Bilirubin: 0.3 mg/dL (calc) (ref 0.2–1.2)
Total Bilirubin: 0.4 mg/dL (ref 0.2–1.2)
Total Protein: 7.1 g/dL (ref 6.1–8.1)

## 2017-07-13 MED ORDER — TERBINAFINE HCL 250 MG PO TABS: 250.0000 mg | ORAL_TABLET | Freq: Every day | ORAL | 0 refills | Status: DC

## 2017-07-13 NOTE — Progress Notes (Signed)
She presents today for a follow-up of her pathology report. States that the toenails are not changed.  Objective: Pathology report does demonstrate onychomycosis.  Assessment: Onychomycosis.  Plan: We discussed the etiology pathology conservative versus surgical therapies. At this point we discussed oral therapy for the treatment of this fungus. She understands this is amenable to an old she was advised of all the risks of the complications arise of this medication. We also dispensed a prescription for liver profile which she will have performed today. We scored and start her on Lamisil 250 mg tablets 1 by mouth daily #30 follow-up with her in 1 month for another liver profile. She will call with questions or concerns regarding medication or any issues with the medication.

## 2017-07-15 ENCOUNTER — Telehealth: Payer: Self-pay | Admitting: *Deleted

## 2017-07-15 NOTE — Telephone Encounter (Addendum)
-----   Message from Elinor ParkinsonMax T Hyatt, [place] sent at 07/13/2017  4:47 PM EDT ----- Blood work looks good and continue medication. 07/15/2017-I informed pt of Dr. Geryl RankinsHyatt's review of results and orders.

## 2017-08-17 ENCOUNTER — Ambulatory Visit (INDEPENDENT_AMBULATORY_CARE_PROVIDER_SITE_OTHER): Payer: BLUE CROSS/BLUE SHIELD | Admitting: Podiatry

## 2017-08-17 DIAGNOSIS — Z79899 Other long term (current) drug therapy: Secondary | ICD-10-CM | POA: Diagnosis not present

## 2017-08-17 NOTE — Progress Notes (Signed)
She presents today for follow-up of her Lamisil therapy. She denies fever chills nausea vomiting muscle aches and pains or rashes or itching with the medication. She states she still has about 6 pills left ago.  Objective: Vital signs are stable she's alert and 3 no change in the nail plates as of yet.  Assessment: Onychomycosis with long-term therapy.  Plan: Requested a liver profile and dispensed another prescription for 90 days of Lamisil. She will call with questions or concerns. We will notify her as to the outcome of these results.

## 2017-08-25 LAB — HEPATIC FUNCTION PANEL
AG RATIO: 1.3 (calc) (ref 1.0–2.5)
ALT: 11 U/L (ref 6–29)
AST: 13 U/L (ref 10–30)
Albumin: 3.9 g/dL (ref 3.6–5.1)
Alkaline phosphatase (APISO): 68 U/L (ref 33–115)
BILIRUBIN DIRECT: 0.1 mg/dL (ref 0.0–0.2)
BILIRUBIN INDIRECT: 0.3 mg/dL (ref 0.2–1.2)
BILIRUBIN TOTAL: 0.4 mg/dL (ref 0.2–1.2)
GLOBULIN: 3.1 g/dL (ref 1.9–3.7)
Total Protein: 7 g/dL (ref 6.1–8.1)

## 2017-08-27 ENCOUNTER — Other Ambulatory Visit: Payer: Self-pay | Admitting: Podiatry

## 2017-09-09 ENCOUNTER — Telehealth: Payer: Self-pay | Admitting: *Deleted

## 2017-09-09 MED ORDER — TERBINAFINE HCL 250 MG PO TABS: 250.0000 mg | ORAL_TABLET | Freq: Every day | ORAL | 0 refills | Status: DC

## 2017-09-09 NOTE — Telephone Encounter (Signed)
Pt states her refill for antifungal has not been called to her pharmacy. I reviewed 08/17/2017 and she was to get another refill of #90. I informed pt of her 08/25/2017 hepatic function labs as normal and that I would send in a refill for the 08/17/2017 lamisil #90.

## 2017-10-26 ENCOUNTER — Other Ambulatory Visit: Payer: Self-pay

## 2017-10-26 ENCOUNTER — Emergency Department (HOSPITAL_COMMUNITY)

## 2017-10-26 ENCOUNTER — Encounter (HOSPITAL_COMMUNITY): Payer: Self-pay | Admitting: *Deleted

## 2017-10-26 ENCOUNTER — Emergency Department (HOSPITAL_COMMUNITY)
Admission: EM | Admit: 2017-10-26 | Discharge: 2017-10-26 | Disposition: A | Discharge status: Home / Self Care | Attending: Emergency Medicine | Admitting: Emergency Medicine

## 2017-10-26 DIAGNOSIS — M791 Myalgia, unspecified site: Secondary | ICD-10-CM | POA: Diagnosis not present

## 2017-10-26 DIAGNOSIS — R05 Cough: Secondary | ICD-10-CM | POA: Insufficient documentation

## 2017-10-26 DIAGNOSIS — R110 Nausea: Secondary | ICD-10-CM | POA: Insufficient documentation

## 2017-10-26 DIAGNOSIS — R5383 Other fatigue: Secondary | ICD-10-CM | POA: Insufficient documentation

## 2017-10-26 DIAGNOSIS — R0981 Nasal congestion: Secondary | ICD-10-CM | POA: Diagnosis not present

## 2017-10-26 DIAGNOSIS — J111 Influenza due to unidentified influenza virus with other respiratory manifestations: Secondary | ICD-10-CM | POA: Diagnosis not present

## 2017-10-26 DIAGNOSIS — R509 Fever, unspecified: Secondary | ICD-10-CM | POA: Diagnosis present

## 2017-10-26 DIAGNOSIS — R69 Illness, unspecified: Secondary | ICD-10-CM

## 2017-10-26 MED ORDER — BENZONATATE 100 MG PO CAPS: 200.0000 mg | ORAL_CAPSULE | Freq: Three times a day (TID) | ORAL | 0 refills | Status: DC

## 2017-10-26 MED ORDER — FLUTICASONE PROPIONATE 50 MCG/ACT NA SUSP: 1.0000 | Freq: Every day | NASAL | 2 refills | Status: DC

## 2017-10-26 MED ORDER — OSELTAMIVIR PHOSPHATE 75 MG PO CAPS: 75.0000 mg | ORAL_CAPSULE | Freq: Two times a day (BID) | ORAL | 0 refills | Status: DC

## 2017-10-26 MED ORDER — IBUPROFEN 400 MG PO TABS
600.0000 mg | ORAL_TABLET | Freq: Once | ORAL | Status: AC
  Administered 2017-10-26: 600 mg via ORAL
  Filled 2017-10-26: qty 1

## 2017-10-26 NOTE — ED Provider Notes (Signed)
[facility] Provider Note   CSN: [id number] Arrival date & time: 10/26/17  1331     History   Chief Complaint Chief Complaint  Patient presents with  . Fever    HPI Annette Jones is a 32 y.o. female who presents to ED for evaluation of flu like symptoms since yesterday. She reports subjective fever, nasal congestion, bodyaches, nausea, productive cough, fatigue. She reports her daughter at home as similar symptoms, as well as guests at a birthday party this weekend. She has taken Theraflu with mild improvement in her symptoms. She denies any chest pain, SOB, wheezing, hemoptysis, prior DVT or PE, vomiting, abdominal pain. She did not receive her influenza vaccine this year.  HPI  Past Medical History:  Diagnosis Date  . Borderline diabetes   . Cellulitis of lower leg 2012   "left"  . Chlamydia 2009   CHLAMYDIA  . Elevated hemoglobin A1c 04/14/2012   5.9 at NOB interview--plan early glucola at 18 weeks.   . Infection    UTI X 1  . Urinary tract infection   . Yeast infection    frequent    Patient Active Problem List   Diagnosis Date Noted  . Morbid obesity with BMI of 40.0-44.9, adult (HCC) 07/13/2016  . Itching 07/13/2016  . Preventative health care 08/16/2015  . Uses contraception 08/16/2015  . Obesity 08/15/2015  . Recurrent cellulitis of lower leg 08/06/2015  . Pre-diabetes   . UTI (urinary tract infection) following delivery 10/25/2012    Past Surgical History:  Procedure Laterality Date  . INDUCED ABORTION      OB History    Gravida Para Term Preterm AB Living   2 1 1   1 1    SAB TAB Ectopic Multiple Live Births     0     1       Home Medications    Prior to Admission medications   Medication Sig Start Date End Date Taking? Authorizing Provider  benzonatate (TESSALON) 100 MG capsule Take 2 capsules (200 mg total) by mouth every 8 (eight) hours. 10/26/17   Orie Baxendale, PA-C  etonogestrel (IMPLANON) 68 MG  IMPL implant Inject 1 each into the skin continuous. Located in left arm    [provider]  fluticasone (FLONASE) 50 MCG/ACT nasal spray Place 1 spray into both nostrils daily. 10/26/17   Veronica Guerrant, PA-C  oseltamivir (TAMIFLU) 75 MG capsule Take 1 capsule (75 mg total) by mouth every 12 (twelve) hours. 10/26/17   Mashonda Broski, PA-C  terbinafine (LAMISIL) 250 MG tablet TAKE 1 TABLET BY MOUTH ONCE DAILY 08/27/17   Hyatt, Max T, DPM  terbinafine (LAMISIL) 250 MG tablet Take 1 tablet (250 mg total) by mouth daily. 09/09/17   Hyatt, Annye RuskMax T, DPM    Family History Family History  Problem Relation Age of Onset  . Hypertension Mother   . Diabetes Maternal Grandmother   . Hypertension Maternal Grandmother   . Hypertension Cousin   . Other Neg Hx     Social History Social History   Tobacco Use  . Smoking status: Never Smoker  . Smokeless tobacco: Never Used  Substance Use Topics  . Alcohol use: Yes    Alcohol/week: 5.4 oz    Types: 2 Shots of liquor, 7 Standard drinks or equivalent per week    Comment: 08/06/2015 "I'll drink some a couple times/month"  . Drug use: No     Allergies   Percocet [oxycodone-acetaminophen]   Review of  Systems Review of Systems  Constitutional: Positive for fatigue. Negative for chills and fever.  HENT: Positive for congestion, ear pain, postnasal drip, rhinorrhea, sneezing and sore throat. Negative for dental problem, ear discharge, mouth sores, sinus pressure, sinus pain, trouble swallowing and voice change.   Respiratory: Positive for cough.   Gastrointestinal: Positive for nausea. Negative for abdominal pain and vomiting.  Musculoskeletal: Positive for myalgias.  Skin: Negative for rash.     Physical Exam Updated Vital Signs BP (!) 142/102 (BP Location: Right Arm)   Pulse (!) 105   Temp 98.4 F (36.9 C) (Oral)   Resp 17   LMP 10/05/2017 (Approximate)   SpO2 97%   Physical Exam  Constitutional: She appears well-developed and  well-nourished. No distress.  HENT:  Head: Normocephalic and atraumatic.  Right Ear: A middle ear effusion is present.  Left Ear: A middle ear effusion is present.  Nose: Rhinorrhea present.  Mouth/Throat: Uvula is midline. No trismus in the jaw. Normal dentition. No uvula swelling. No posterior oropharyngeal edema or posterior oropharyngeal erythema. No tonsillar exudate.  Patient does not appear to be in acute distress. No trismus or drooling present. No pooling of secretions. Patient is tolerating secretions and is not in respiratory distress. No neck pain or tenderness to palpation of the neck. Full active and passive range of motion of the neck. No evidence of RPA or PTA.  Eyes: Conjunctivae and EOM are normal. No scleral icterus.  Neck: Normal range of motion.  Cardiovascular: Normal rate, regular rhythm and normal heart sounds.  Pulmonary/Chest: Effort normal and breath sounds normal. No respiratory distress.  Neurological: She is alert.  Skin: No rash noted. She is not diaphoretic.  Psychiatric: She has a normal mood and affect.  Nursing note and vitals reviewed.    ED Treatments / Results  Labs (all labs ordered are listed, but only abnormal results are displayed) Labs Reviewed - No data to display  EKG  EKG Interpretation None       Radiology Dg Chest 2 View  Result Date: 10/26/2017 CLINICAL DATA:  Cough, fever. EXAM: CHEST  2 VIEW COMPARISON:  Radiographs of August 06, 2015. FINDINGS: The heart size and mediastinal contours are within normal limits. Both lungs are clear. No pneumothorax or pleural effusion is noted. The visualized skeletal structures are unremarkable. IMPRESSION: No active cardiopulmonary disease. Electronically Signed   By: Lupita Raider, M.D.   On: 10/26/2017 15:35    Procedures Procedures (including critical care time)  Medications Ordered in ED Medications  ibuprofen (ADVIL,MOTRIN) tablet 600 mg (600 mg Oral Given 10/26/17 1424)      Initial Impression / Assessment and Plan / ED Course  I have reviewed the triage vital signs and the nursing notes.  Pertinent labs & imaging results that were available during my care of the patient were reviewed by me and considered in my medical decision making (see chart for details).     Patient presents to ED for evaluation of influenza like illness since yesterday. Sick contacts with similar symptoms. Does not appear in distress and denies chest pain, shortness of breath, hemoptysis, PE or DVT risk factors. CXR negative. Vitals including HR and temp improved here with supportive measures. Since she is in the window for Tamiflu treatment, will rx as well as symptomatic treatment. Patient counseled on possible side effects of Tamiflu administration but wishes to proceed. Doubt bacterial infection, appears viral. Patient appears stable for discharge at this time. Strict return precautions given.  Final  Clinical Impressions(s) / ED Diagnoses   Final diagnoses:  Influenza-like illness    ED Discharge Orders        Ordered    oseltamivir (TAMIFLU) 75 MG capsule  Every 12 hours     10/26/17 1643    fluticasone (FLONASE) 50 MCG/ACT nasal spray  Daily     10/26/17 1643    benzonatate (TESSALON) 100 MG capsule  Every 8 hours     10/26/17 1643     Portions of this note were generated with Dragon dictation software. Dictation errors may occur despite best attempts at proofreading.    Dietrich Pates, PA-C 10/26/17 1649    Little, Ambrose Finland, MD 10/29/17 [phone]

## 2017-10-26 NOTE — Discharge Instructions (Signed)
Please read attached information regarding your condition. Take Tamiflu beginning to today to help with flu symptoms. Take Tessalon Perles as needed for cough.  Use Flonase as needed for nasal congestion. Tylenol or ibuprofen as needed for body aches and fever. Return to ED for worsening symptoms, chest pain, shortness of breath, lightheadedness, loss of consciousness, increased vomiting.

## 2017-10-26 NOTE — ED Notes (Signed)
Patient verbalized understanding of discharge instructions and denies any further needs or questions at this time. VS stable. Patient ambulatory with steady gait.  

## 2017-10-26 NOTE — ED Triage Notes (Signed)
Pt states her and her child both feeling bad.  She went to a birthday party on Saturday and 2 people there were diagnosed with the flu and one with PNA.  Pt reports body aches, headaches, sore throat, and cough.

## 2017-12-14 ENCOUNTER — Encounter (INDEPENDENT_AMBULATORY_CARE_PROVIDER_SITE_OTHER): Payer: BLUE CROSS/BLUE SHIELD | Admitting: Podiatry

## 2017-12-14 NOTE — Progress Notes (Signed)
This encounter was created in error - please disregard.

## 2017-12-15 ENCOUNTER — Encounter (HOSPITAL_COMMUNITY): Payer: Self-pay | Admitting: Emergency Medicine

## 2017-12-15 ENCOUNTER — Ambulatory Visit (HOSPITAL_COMMUNITY)
Admission: EM | Admit: 2017-12-15 | Discharge: 2017-12-15 | Disposition: A | Discharge status: Home / Self Care | Payer: BLUE CROSS/BLUE SHIELD | Attending: Family Medicine | Admitting: Family Medicine

## 2017-12-15 ENCOUNTER — Other Ambulatory Visit: Payer: Self-pay

## 2017-12-15 DIAGNOSIS — S6992XA Unspecified injury of left wrist, hand and finger(s), initial encounter: Secondary | ICD-10-CM | POA: Diagnosis not present

## 2017-12-15 MED ORDER — MELOXICAM 7.5 MG PO TABS: 7.5000 mg | ORAL_TABLET | Freq: Every day | ORAL | 0 refills | Status: DC

## 2017-12-15 MED ORDER — MUPIROCIN 2 % EX OINT: 1.0000 "application " | TOPICAL_OINTMENT | Freq: Two times a day (BID) | CUTANEOUS | 0 refills | Status: DC

## 2017-12-15 MED ORDER — AMOXICILLIN-POT CLAVULANATE 875-125 MG PO TABS: 1.0000 | ORAL_TABLET | Freq: Two times a day (BID) | ORAL | 0 refills | Status: DC

## 2017-12-15 NOTE — ED Provider Notes (Signed)
[facility]    CSN: [id number] Arrival date & time: 12/15/17  1514     History   Chief Complaint Chief Complaint  Patient presents with  . Finger Injury    HPI Annette Jones is a 32 y.o. female.   32 year old female comes in for 3-day history of injury to the left ring finger fingernail.  Patient states that she had acrylic nails, which got caught on the laundry basket 3 days ago, which lifted her fingernail.  Cleaned area with hydrogen peroxide, and wrapped the area.  She had her acrylic nails removed yesterday, and noticed some discharge to the site.  She has some surrounding erythema without increased warmth.  Denies fever, chills, night sweats.       Past Medical History:  Diagnosis Date  . Borderline diabetes   . Cellulitis of lower leg 2012   "left"  . Chlamydia 2009   CHLAMYDIA  . Elevated hemoglobin A1c 04/14/2012   5.9 at NOB interview--plan early glucola at 18 weeks.   . Infection    UTI X 1  . Urinary tract infection   . Yeast infection    frequent    Patient Active Problem List   Diagnosis Date Noted  . Morbid obesity with BMI of 40.0-44.9, adult (HCC) 07/13/2016  . Itching 07/13/2016  . Preventative health care 08/16/2015  . Uses contraception 08/16/2015  . Obesity 08/15/2015  . Recurrent cellulitis of lower leg 08/06/2015  . Pre-diabetes   . UTI (urinary tract infection) following delivery 10/25/2012    Past Surgical History:  Procedure Laterality Date  . INDUCED ABORTION      OB History    Gravida Para Term Preterm AB Living   2 1 1   1 1    SAB TAB Ectopic Multiple Live Births     0     1       Home Medications    Prior to Admission medications   Medication Sig Start Date End Date Taking? Authorizing Provider  levonorgestrel (MIRENA) 20 MCG/24HR IUD 1 each by Intrauterine route once.   Yes [provider]  amoxicillin-clavulanate (AUGMENTIN) 875-125 MG tablet Take 1 tablet by mouth every 12 (twelve) hours.  12/15/17   Cathie Hoops, Amy V, PA-C  meloxicam (MOBIC) 7.5 MG tablet Take 1 tablet (7.5 mg total) by mouth daily. 12/15/17   Cathie Hoops, Amy V, PA-C  mupirocin ointment (BACTROBAN) 2 % Apply 1 application topically 2 (two) times daily. 12/15/17   Cathie Hoops, Amy V, PA-C  terbinafine (LAMISIL) 250 MG tablet TAKE 1 TABLET BY MOUTH ONCE DAILY 08/27/17   Hyatt, Max T, DPM  terbinafine (LAMISIL) 250 MG tablet Take 1 tablet (250 mg total) by mouth daily. 09/09/17   Hyatt, Annye Rusk, DPM    Family History Family History  Problem Relation Age of Onset  . Hypertension Mother   . Diabetes Maternal Grandmother   . Hypertension Maternal Grandmother   . Hypertension Cousin   . Other Neg Hx     Social History Social History   Tobacco Use  . Smoking status: Never Smoker  . Smokeless tobacco: Never Used  Substance Use Topics  . Alcohol use: Yes    Alcohol/week: 5.4 oz    Types: 2 Shots of liquor, 7 Standard drinks or equivalent per week    Comment: 08/06/2015 "I'll drink some a couple times/month"  . Drug use: No     Allergies   Percocet [oxycodone-acetaminophen]   Review of Systems Review of Systems  Reason  unable to perform ROS: See HPI as above.     Physical Exam Triage Vital Signs ED Triage Vitals  Enc Vitals Group     BP 12/15/17 1647 (!) 156/95     Pulse Rate 12/15/17 1647 65     Resp --      Temp 12/15/17 1647 98.2 F (36.8 C)     Temp Source 12/15/17 1647 Oral     SpO2 12/15/17 1647 100 %     Weight --      Height --      Head Circumference --      Peak Flow --      Pain Score 12/15/17 1644 3     Pain Loc --      Pain Edu? --      Excl. in GC? --    No data found.  Updated Vital Signs BP (!) 156/95 (BP Location: Left Arm)   Pulse 65   Temp 98.2 F (36.8 C) (Oral)   LMP 12/12/2017   SpO2 100%    Physical Exam  Constitutional: She is oriented to person, place, and time. She appears well-developed and well-nourished. No distress.  HENT:  Head: Normocephalic and atraumatic.  Eyes:  Conjunctivae are normal. Pupils are equal, round, and reactive to light.  Musculoskeletal:  See picture below. Surrounding erythema without increased warmth. Full ROM. Sensation intact. Cap refill <2s  Neurological: She is alert and oriented to person, place, and time.       UC Treatments / Results  Labs (all labs ordered are listed, but only abnormal results are displayed) Labs Reviewed - No data to display  EKG  EKG Interpretation None       Radiology No results found.  Procedures Procedures (including critical care time)  Medications Ordered in UC Medications - No data to display   Initial Impression / Assessment and Plan / UC Course  I have reviewed the triage vital signs and the nursing notes.  Pertinent labs & imaging results that were available during my care of the patient were reviewed by me and considered in my medical decision making (see chart for details).    Start augmentin for possible cellulitis. bactroban ointment as needed on affected site. mobic for pain and inflammation. Return precautions given. Patient expresses understanding and agrees to plan.   Final Clinical Impressions(s) / UC Diagnoses   Final diagnoses:  Injury of finger of left hand, initial encounter    ED Discharge Orders        Ordered    amoxicillin-clavulanate (AUGMENTIN) 875-125 MG tablet  Every 12 hours     12/15/17 1735    mupirocin ointment (BACTROBAN) 2 %  2 times daily     12/15/17 1735    meloxicam (MOBIC) 7.5 MG tablet  Daily     12/15/17 1735        Belinda FisherYu, Amy V, PA-C 12/15/17 1745

## 2017-12-15 NOTE — ED Triage Notes (Signed)
States left hand fourth digit is painful to the touch, had acrylic nails removed yesterday after getting finger caught in laundry basket

## 2017-12-15 NOTE — Discharge Instructions (Signed)
Start augmentin for skin infection. You can apply bactroban ointment on affected area. You can dress the finger to prevent further injury to the finger nail. Mobic to help with pain. Monitor for spreading redness, increased warmth, fever, follow up for reevaluation.

## 2019-04-04 IMAGING — DX DG CHEST 2V
2 series · 2 of 2 positions shown · non-contrast
Comparison: Radiographs August 06, 2015.

CLINICAL DATA: Cough, fever.

EXAM:
CHEST  2 VIEW

[chest pa]
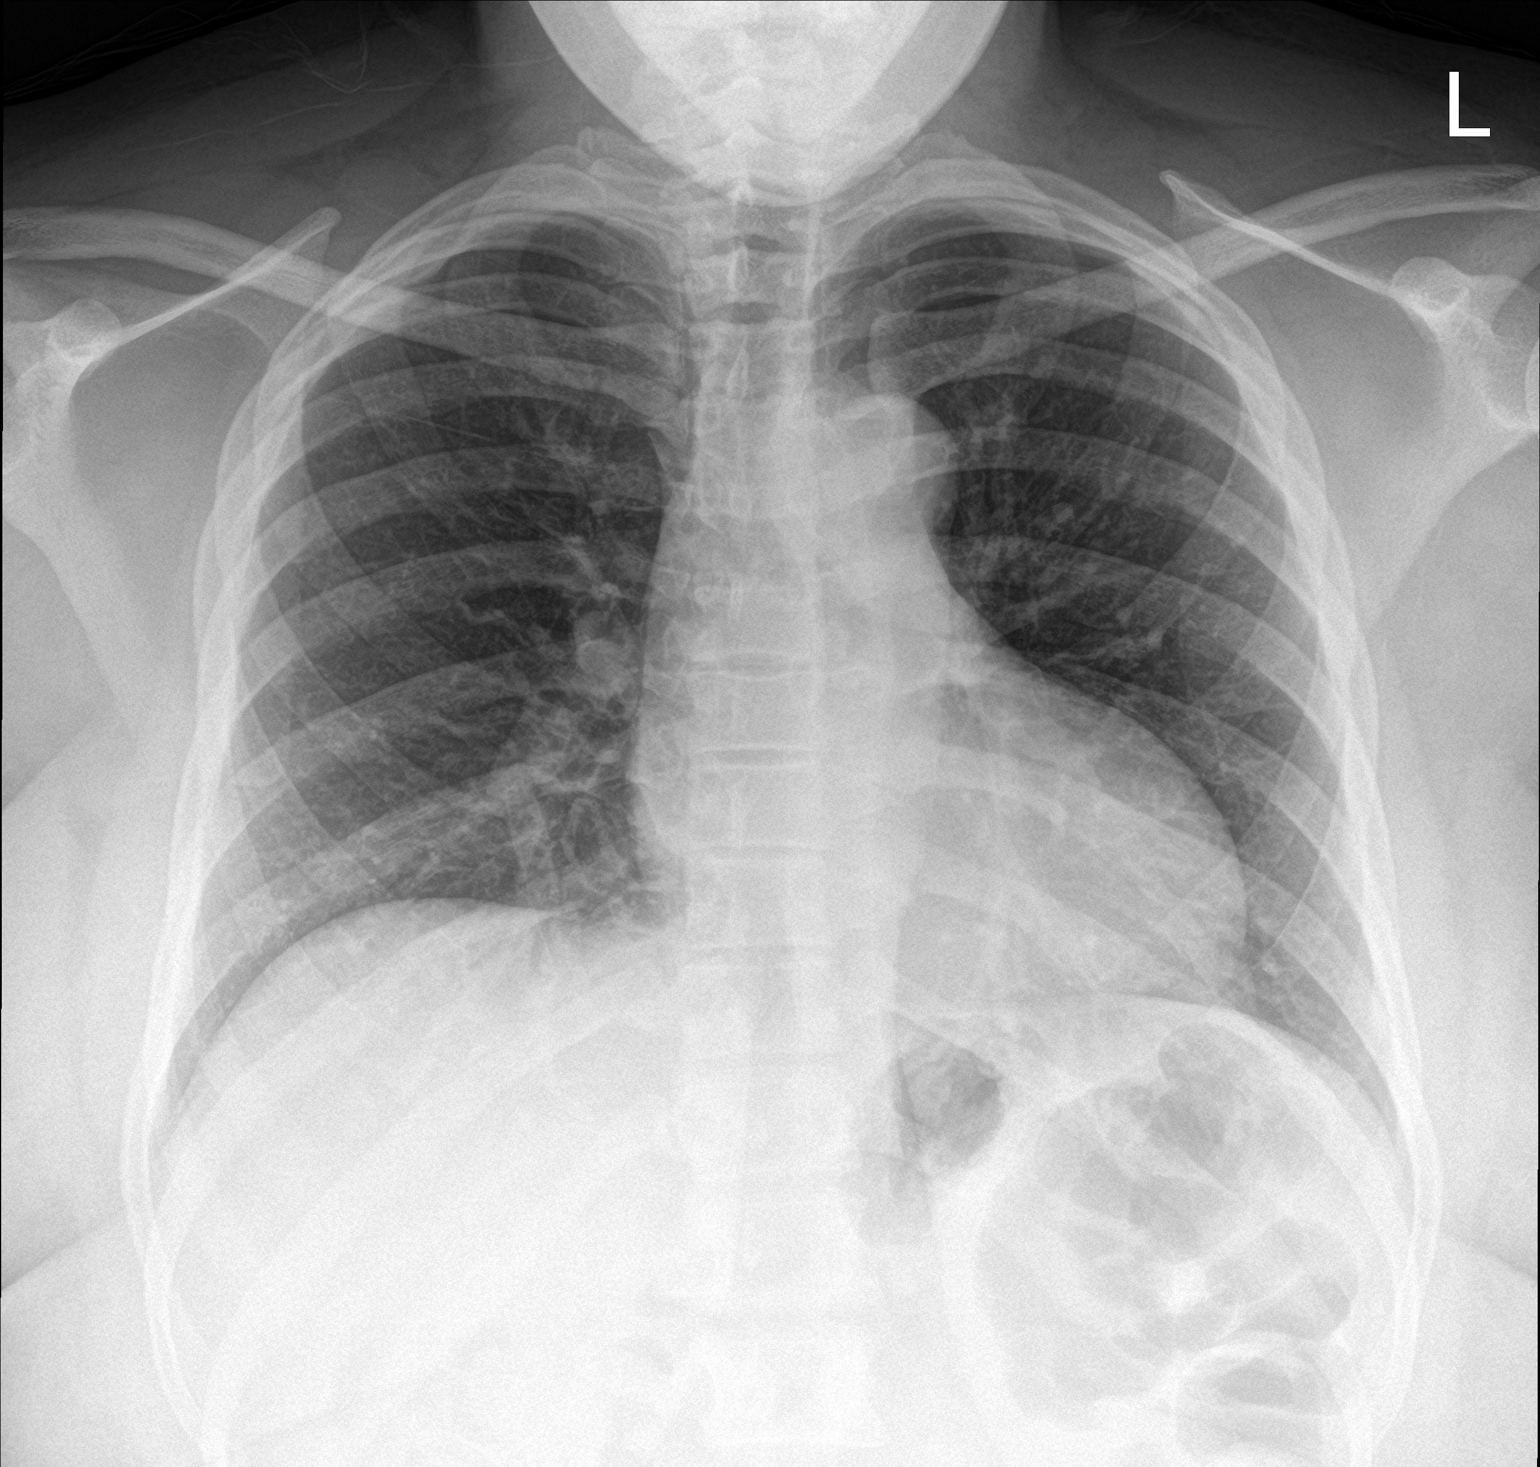

[chest lat]
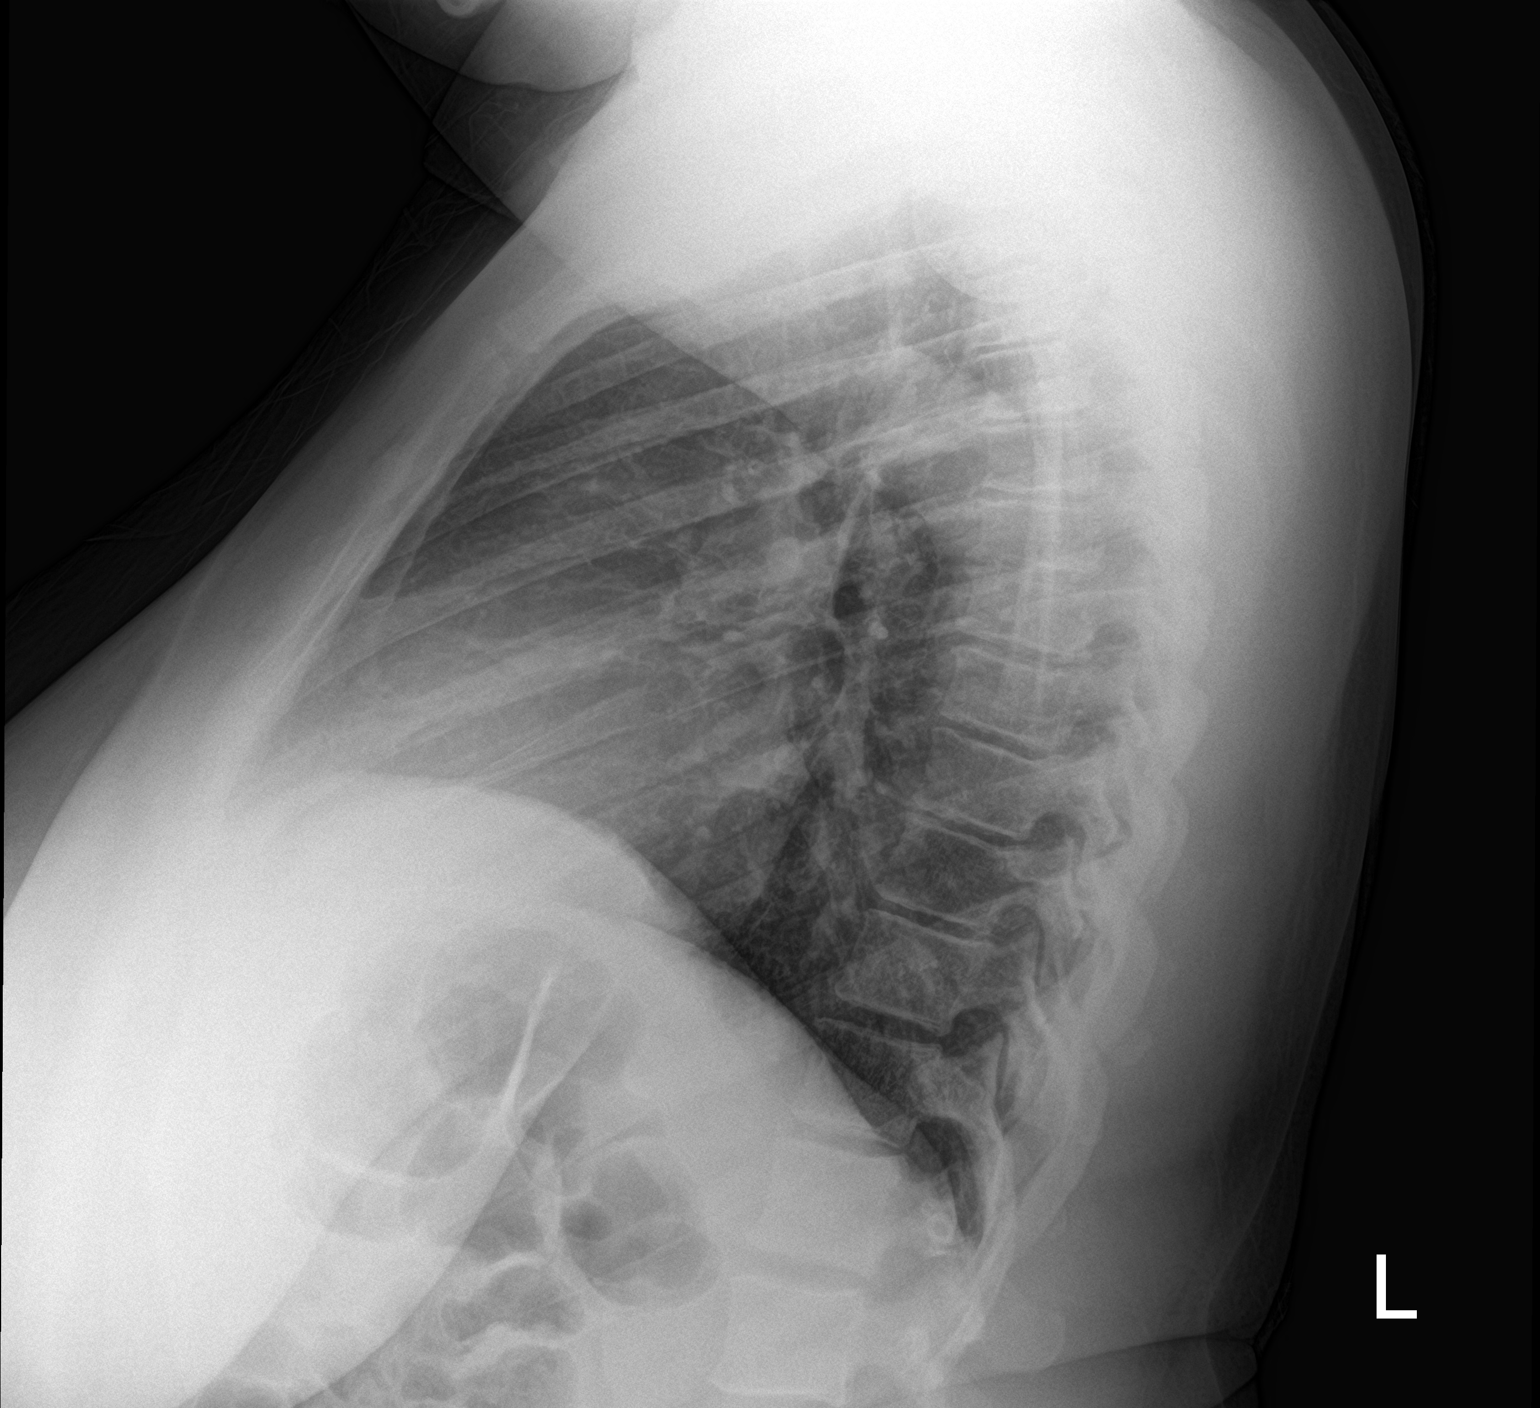

[2 of 2 positions shown; findings below may reference images not displayed]

FINDINGS: The heart size and mediastinal contours are within normal limits.
Both lungs are clear. No pneumothorax or pleural effusion is noted.
The visualized skeletal structures are unremarkable.
IMPRESSION: No active cardiopulmonary disease.

## 2019-05-09 ENCOUNTER — Encounter (HOSPITAL_COMMUNITY): Payer: Self-pay

## 2019-05-09 ENCOUNTER — Emergency Department (HOSPITAL_COMMUNITY)
Admission: EM | Admit: 2019-05-09 | Discharge: 2019-05-09 | Disposition: A | Discharge status: Home / Self Care | Payer: 59 | Attending: Emergency Medicine | Admitting: Emergency Medicine

## 2019-05-09 ENCOUNTER — Other Ambulatory Visit: Payer: Self-pay

## 2019-05-09 DIAGNOSIS — R05 Cough: Secondary | ICD-10-CM | POA: Diagnosis present

## 2019-05-09 DIAGNOSIS — Z79899 Other long term (current) drug therapy: Secondary | ICD-10-CM | POA: Diagnosis not present

## 2019-05-09 DIAGNOSIS — J988 Other specified respiratory disorders: Secondary | ICD-10-CM | POA: Insufficient documentation

## 2019-05-09 DIAGNOSIS — B9789 Other viral agents as the cause of diseases classified elsewhere: Secondary | ICD-10-CM

## 2019-05-09 DIAGNOSIS — Z6841 Body Mass Index (BMI) 40.0 and over, adult: Secondary | ICD-10-CM | POA: Diagnosis not present

## 2019-05-09 DIAGNOSIS — Z2089 Contact with and (suspected) exposure to other communicable diseases: Secondary | ICD-10-CM

## 2019-05-09 DIAGNOSIS — Z20828 Contact with and (suspected) exposure to other viral communicable diseases: Secondary | ICD-10-CM | POA: Insufficient documentation

## 2019-05-09 DIAGNOSIS — E669 Obesity, unspecified: Secondary | ICD-10-CM | POA: Diagnosis not present

## 2019-05-09 NOTE — Discharge Instructions (Addendum)
Please read attached information. If you experience any new or worsening signs or symptoms please return to the emergency room for evaluation. Please follow-up with your primary care provider or specialist as discussed. Please use medication prescribed only as directed and discontinue taking if you have any concerning signs or symptoms.   °

## 2019-05-09 NOTE — ED Provider Notes (Signed)
[facility] EMERGENCY DEPARTMENT Provider Note   CSN: [id number] Arrival date & time: 05/09/19  0758    History   Chief Complaint Chief Complaint  Patient presents with  . Cough  . COVID Exposure    HPI Annette Jones is a 33 y.o. female.     HPI   33 year old female presents today with complaints of cold exposure.  Patient notes she woke up this morning with cough, she noted blood in her sputum.  She denies any chest pain or shortness of breath, denies any fevers.  She feels otherwise well.  She notes that her mother is currently at [facility] and has COVID.  She notes chronic edema to the lower extremities, new knee changes.   Past Medical History:  Diagnosis Date  . Borderline diabetes   . Cellulitis of lower leg 2012   "left"  . Chlamydia 2009   CHLAMYDIA  . Elevated hemoglobin A1c 04/14/2012   5.9 at NOB interview--plan early glucola at 18 weeks.   . Infection    UTI X 1  . Urinary tract infection   . Yeast infection    frequent    Patient Active Problem List   Diagnosis Date Noted  . Morbid obesity with BMI of 40.0-44.9, adult ([place]) 07/13/2016  . Itching 07/13/2016  . Preventative health care 08/16/2015  . Uses contraception 08/16/2015  . Obesity 08/15/2015  . Recurrent cellulitis of lower leg 08/06/2015  . Pre-diabetes   . UTI (urinary tract infection) following delivery 10/25/2012    Past Surgical History:  Procedure Laterality Date  . INDUCED ABORTION       OB History    Gravida  2   Para  1   Term  1   Preterm      AB  1   Living  1     SAB      TAB  0   Ectopic      Multiple      Live Births  1            Home Medications    Prior to Admission medications   Medication Sig Start Date End Date Taking? Authorizing Provider  amoxicillin-clavulanate (AUGMENTIN) 875-125 MG tablet Take 1 tablet by mouth every 12 (twelve) hours. 12/15/17   Ok Edwards, PA-C  levonorgestrel (MIRENA) 20 MCG/24HR IUD 1 each  by Intrauterine route once.    [provider]  meloxicam (MOBIC) 7.5 MG tablet Take 1 tablet (7.5 mg total) by mouth daily. 12/15/17   Tasia Catchings, Amy V, PA-C  mupirocin ointment (BACTROBAN) 2 % Apply 1 application topically 2 (two) times daily. 12/15/17   Tasia Catchings, Amy V, PA-C  terbinafine (LAMISIL) 250 MG tablet TAKE 1 TABLET BY MOUTH ONCE DAILY 08/27/17   Hyatt, Max T, DPM  terbinafine (LAMISIL) 250 MG tablet Take 1 tablet (250 mg total) by mouth daily. 09/09/17   Hyatt, Romilda Garret, DPM    Family History Family History  Problem Relation Age of Onset  . Hypertension Mother   . Diabetes Maternal Grandmother   . Hypertension Maternal Grandmother   . Hypertension Cousin   . Other Neg Hx     Social History Social History   Tobacco Use  . Smoking status: Never Smoker  . Smokeless tobacco: Never Used  Substance Use Topics  . Alcohol use: Yes    Alcohol/week: 9.0 standard drinks    Types: 2 Shots of liquor, 7 Standard drinks or equivalent per week  Comment: 08/06/2015 "I'll drink some a couple times/month"  . Drug use: No     Allergies   Percocet [oxycodone-acetaminophen]   Review of Systems Review of Systems  All other systems reviewed and are negative.   Physical Exam Updated Vital Signs BP 135/90 (BP Location: Right Arm)   Pulse 98   Temp 99.2 F (37.3 C) (Oral)   Resp 16   Ht 5\' 6"  (1.676 m)   Wt 113.4 kg   SpO2 99%   BMI 40.35 kg/m   Physical Exam Vitals signs and nursing note reviewed.  Constitutional:      Appearance: She is well-developed.  HENT:     Head: Normocephalic and atraumatic.  Eyes:     General: No scleral icterus.       Right eye: No discharge.        Left eye: No discharge.     Conjunctiva/sclera: Conjunctivae normal.     Pupils: Pupils are equal, round, and reactive to light.  Neck:     Musculoskeletal: Normal range of motion.     Vascular: No JVD.     Trachea: No tracheal deviation.  Pulmonary:     Effort: Pulmonary effort is normal. No  respiratory distress.     Breath sounds: Normal breath sounds. No stridor. No wheezing, rhonchi or rales.  Neurological:     Mental Status: She is alert and oriented to person, place, and time.     Coordination: Coordination normal.  Psychiatric:        Behavior: Behavior normal.        Thought Content: Thought content normal.        Judgment: Judgment normal.      ED Treatments / Results  Labs (all labs ordered are listed, but only abnormal results are displayed) Labs Reviewed - No data to display  EKG None  Radiology No results found.  Procedures Procedures (including critical care time)  Medications Ordered in ED Medications - No data to display   Initial Impression / Assessment and Plan / ED Course  I have reviewed the triage vital signs and the nursing notes.  Pertinent labs & imaging results that were available during my care of the patient were reviewed by me and considered in my medical decision making (see chart for details).          Assessment/Plan: 33 year old female presents today with possible COVID.  She is very well-appearing no acute distress.  She has no signs of bacterial etiology, no signs of pulmonary embolism or any other acute life-threatening etiology presently.  Patient already has covert testing pending.  She will return immediately if she develops any new or worsening signs or symptoms.  She verbalized understanding and agreement to today's plan had no further questions concerns at the time of discharge.  Annette Jones was evaluated in Emergency Department on 05/09/2019 for the symptoms described in the history of present illness. She was evaluated in the context of the global COVID-19 pandemic, which necessitated consideration that the patient might be at risk for infection with the SARS-CoV-2 virus that causes COVID-19. Institutional protocols and algorithms that pertain to the evaluation of patients at risk for COVID-19 are in a state of rapid  change based on information released by regulatory bodies including the CDC and federal and state organizations. These policies and algorithms were followed during the patient's care in the ED.    Final Clinical Impressions(s) / ED Diagnoses   Final diagnoses:  Viral respiratory infection  Contact  with and (suspected) exposure to other communicable diseases    ED Discharge Orders    None       Rosalio LoudHedges, Emilyanne Mcgough, PA-C 05/09/19 [phone]    Pricilla LovelessGoldston, Scott, MD 05/10/19 [phone]

## 2019-05-09 NOTE — ED Triage Notes (Addendum)
Pt endorses cough since this morning, stated there was blood in her cough. Also having on and off ha's. Denies fever, chills. Pt's mother has covid and pt worried she has it. States that she has been tested but does not know results. Pt states "i'm just worried I'm gonna get it"

## 2019-05-09 NOTE — ED Notes (Signed)
Pt verbalized understanding of d/c instructions and has no further questions, VSS, NAD.  

## 2019-08-10 ENCOUNTER — Other Ambulatory Visit: Payer: Self-pay | Admitting: Internal Medicine

## 2019-08-10 ENCOUNTER — Other Ambulatory Visit (HOSPITAL_COMMUNITY)
Admission: RE | Admit: 2019-08-10 | Discharge: 2019-08-10 | Disposition: A | Discharge status: Home / Self Care | Payer: 59 | Source: Ambulatory Visit | Attending: Internal Medicine | Admitting: Internal Medicine

## 2019-08-10 DIAGNOSIS — Z01419 Encounter for gynecological examination (general) (routine) without abnormal findings: Secondary | ICD-10-CM | POA: Insufficient documentation

## 2019-08-15 LAB — CYTOLOGY - PAP
Comment: NEGATIVE
Diagnosis: NEGATIVE
High risk HPV: NEGATIVE

## 2021-11-10 ENCOUNTER — Encounter (HOSPITAL_BASED_OUTPATIENT_CLINIC_OR_DEPARTMENT_OTHER): Payer: Self-pay | Admitting: Emergency Medicine

## 2021-11-10 ENCOUNTER — Emergency Department (HOSPITAL_BASED_OUTPATIENT_CLINIC_OR_DEPARTMENT_OTHER): Payer: 59

## 2021-11-10 ENCOUNTER — Observation Stay (HOSPITAL_BASED_OUTPATIENT_CLINIC_OR_DEPARTMENT_OTHER)
Admission: EM | Admit: 2021-11-10 | Discharge: 2021-11-12 | Disposition: A | Discharge status: Home / Self Care | Payer: 59 | Attending: Emergency Medicine | Admitting: Emergency Medicine

## 2021-11-10 ENCOUNTER — Other Ambulatory Visit: Payer: Self-pay

## 2021-11-10 DIAGNOSIS — R7303 Prediabetes: Secondary | ICD-10-CM | POA: Diagnosis not present

## 2021-11-10 DIAGNOSIS — I10 Essential (primary) hypertension: Secondary | ICD-10-CM | POA: Insufficient documentation

## 2021-11-10 DIAGNOSIS — I160 Hypertensive urgency: Secondary | ICD-10-CM | POA: Diagnosis not present

## 2021-11-10 DIAGNOSIS — K8020 Calculus of gallbladder without cholecystitis without obstruction: Secondary | ICD-10-CM | POA: Diagnosis not present

## 2021-11-10 DIAGNOSIS — Z20822 Contact with and (suspected) exposure to covid-19: Secondary | ICD-10-CM | POA: Diagnosis not present

## 2021-11-10 DIAGNOSIS — K8010 Calculus of gallbladder with chronic cholecystitis without obstruction: Principal | ICD-10-CM | POA: Insufficient documentation

## 2021-11-10 DIAGNOSIS — R10811 Right upper quadrant abdominal tenderness: Secondary | ICD-10-CM | POA: Diagnosis present

## 2021-11-10 DIAGNOSIS — N289 Disorder of kidney and ureter, unspecified: Secondary | ICD-10-CM

## 2021-11-10 DIAGNOSIS — E66813 Obesity, class 3: Secondary | ICD-10-CM | POA: Diagnosis present

## 2021-11-10 DIAGNOSIS — D649 Anemia, unspecified: Secondary | ICD-10-CM

## 2021-11-10 LAB — URINALYSIS, ROUTINE W REFLEX MICROSCOPIC
Bilirubin Urine: NEGATIVE
Glucose, UA: NEGATIVE mg/dL
Ketones, ur: NEGATIVE mg/dL
Nitrite: NEGATIVE
Protein, ur: NEGATIVE mg/dL
Specific Gravity, Urine: 1.011 (ref 1.005–1.030)
pH: 7 (ref 5.0–8.0)

## 2021-11-10 LAB — COMPREHENSIVE METABOLIC PANEL
ALT: 10 U/L (ref 0–44)
AST: 15 U/L (ref 15–41)
Albumin: 4.2 g/dL (ref 3.5–5.0)
Alkaline Phosphatase: 71 U/L (ref 38–126)
Anion gap: 7 (ref 5–15)
BUN: 12 mg/dL (ref 6–20)
CO2: 25 mmol/L (ref 22–32)
Calcium: 9.1 mg/dL (ref 8.9–10.3)
Chloride: 104 mmol/L (ref 98–111)
Creatinine, Ser: 1.06 mg/dL — ABNORMAL HIGH (ref 0.44–1.00)
GFR, Estimated: >60 mL/min (ref >60)
Glucose, Bld: 110 mg/dL — ABNORMAL HIGH (ref 70–99)
Potassium: 3.8 mmol/L (ref 3.5–5.1)
Sodium: 136 mmol/L (ref 135–145)
Total Bilirubin: 0.3 mg/dL (ref 0.3–1.2)
Total Protein: 7.6 g/dL (ref 6.5–8.1)

## 2021-11-10 LAB — RESP PANEL BY RT-PCR (FLU A&B, COVID) ARPGX2
Influenza A by PCR: NEGATIVE
Influenza B by PCR: NEGATIVE
SARS Coronavirus 2 by RT PCR: NEGATIVE

## 2021-11-10 LAB — CBC
HCT: 36.1 % (ref 36.0–46.0)
Hemoglobin: 11.5 g/dL — ABNORMAL LOW (ref 12.0–15.0)
MCH: 26.9 pg (ref 26.0–34.0)
MCHC: 31.9 g/dL (ref 30.0–36.0)
MCV: 84.3 fL (ref 80.0–100.0)
Platelets: 302 10*3/uL (ref 150–400)
RBC: 4.28 MIL/uL (ref 3.87–5.11)
RDW: 12.8 % (ref 11.5–15.5)
WBC: 6.6 10*3/uL (ref 4.0–10.5)
nRBC: 0 % (ref 0.0–0.2)

## 2021-11-10 LAB — LIPASE, BLOOD: Lipase: <10 U/L — ABNORMAL LOW (ref 11–51)

## 2021-11-10 LAB — HCG, SERUM, QUALITATIVE: Preg, Serum: NEGATIVE

## 2021-11-10 MED ORDER — IBUPROFEN 600 MG PO TABS
600.0000 mg | ORAL_TABLET | Freq: Four times a day (QID) | ORAL | Status: DC | PRN
  Administered 2021-11-10 – 2021-11-12 (×2): 600 mg via ORAL
  Filled 2021-11-10 (×2): qty 1

## 2021-11-10 MED ORDER — SODIUM CHLORIDE 0.9 % IV SOLN
1.0000 g | INTRAVENOUS | Status: DC
  Filled 2021-11-10: qty 10

## 2021-11-10 MED ORDER — DIPHENHYDRAMINE HCL 50 MG/ML IJ SOLN: 25.0000 mg | Freq: Four times a day (QID) | INTRAMUSCULAR | Status: DC | PRN

## 2021-11-10 MED ORDER — FENTANYL CITRATE PF 50 MCG/ML IJ SOSY
25.0000 ug | PREFILLED_SYRINGE | INTRAMUSCULAR | Status: DC | PRN
  Administered 2021-11-11: 25 ug via INTRAVENOUS
  Filled 2021-11-10: qty 1

## 2021-11-10 MED ORDER — HYDRALAZINE HCL 20 MG/ML IJ SOLN
10.0000 mg | Freq: Three times a day (TID) | INTRAMUSCULAR | Status: DC | PRN
  Administered 2021-11-10 – 2021-11-11 (×2): 10 mg via INTRAVENOUS
  Filled 2021-11-10 (×3): qty 1

## 2021-11-10 MED ORDER — ENOXAPARIN SODIUM 40 MG/0.4ML IJ SOSY
40.0000 mg | PREFILLED_SYRINGE | INTRAMUSCULAR | Status: DC
  Administered 2021-11-10 – 2021-11-11 (×2): 40 mg via SUBCUTANEOUS
  Filled 2021-11-10 (×2): qty 0.4

## 2021-11-10 MED ORDER — MORPHINE SULFATE (PF) 2 MG/ML IV SOLN: 2.0000 mg | INTRAVENOUS | Status: DC | PRN

## 2021-11-10 MED ORDER — HYDROCODONE-ACETAMINOPHEN 5-325 MG PO TABS
1.0000 | ORAL_TABLET | ORAL | Status: DC | PRN
  Administered 2021-11-12: 2 via ORAL
  Filled 2021-11-10: qty 2

## 2021-11-10 MED ORDER — SODIUM CHLORIDE 0.9 % IV SOLN: INTRAVENOUS | Status: DC

## 2021-11-10 MED ORDER — LORAZEPAM 2 MG/ML IJ SOLN
0.5000 mg | Freq: Four times a day (QID) | INTRAMUSCULAR | Status: DC | PRN
  Administered 2021-11-11: 0.5 mg via INTRAVENOUS
  Filled 2021-11-10: qty 1

## 2021-11-10 MED ORDER — ONDANSETRON HCL 4 MG/2ML IJ SOLN
4.0000 mg | Freq: Once | INTRAMUSCULAR | Status: AC
  Administered 2021-11-10: 4 mg via INTRAVENOUS
  Filled 2021-11-10: qty 2

## 2021-11-10 MED ORDER — SIMETHICONE 80 MG PO CHEW: 40.0000 mg | CHEWABLE_TABLET | Freq: Four times a day (QID) | ORAL | Status: DC | PRN

## 2021-11-10 MED ORDER — ONDANSETRON HCL 4 MG/2ML IJ SOLN
4.0000 mg | Freq: Four times a day (QID) | INTRAMUSCULAR | Status: DC | PRN
  Administered 2021-11-11 (×2): 4 mg via INTRAVENOUS
  Filled 2021-11-10 (×2): qty 2

## 2021-11-10 MED ORDER — METHOCARBAMOL 500 MG PO TABS: 500.0000 mg | ORAL_TABLET | Freq: Four times a day (QID) | ORAL | Status: DC | PRN

## 2021-11-10 MED ORDER — BISACODYL 10 MG RE SUPP: 10.0000 mg | Freq: Every day | RECTAL | Status: DC | PRN

## 2021-11-10 MED ORDER — SODIUM CHLORIDE 0.9 % IV SOLN
2.0000 g | INTRAVENOUS | Status: DC
  Administered 2021-11-10: 2 g via INTRAVENOUS
  Filled 2021-11-10: qty 20

## 2021-11-10 MED ORDER — ACETAMINOPHEN 650 MG RE SUPP: 650.0000 mg | Freq: Four times a day (QID) | RECTAL | Status: DC | PRN

## 2021-11-10 MED ORDER — ACETAMINOPHEN 325 MG PO TABS: 650.0000 mg | ORAL_TABLET | Freq: Four times a day (QID) | ORAL | Status: DC | PRN

## 2021-11-10 MED ORDER — METOPROLOL TARTRATE 5 MG/5ML IV SOLN
5.0000 mg | Freq: Four times a day (QID) | INTRAVENOUS | Status: DC | PRN
  Administered 2021-11-10 – 2021-11-11 (×2): 5 mg via INTRAVENOUS
  Filled 2021-11-10 (×2): qty 5

## 2021-11-10 MED ORDER — LOSARTAN POTASSIUM 50 MG PO TABS
50.0000 mg | ORAL_TABLET | Freq: Every day | ORAL | Status: DC
  Administered 2021-11-10 – 2021-11-11 (×2): 50 mg via ORAL
  Filled 2021-11-10 (×2): qty 1

## 2021-11-10 MED ORDER — POLYETHYLENE GLYCOL 3350 17 G PO PACK: 17.0000 g | PACK | Freq: Every day | ORAL | Status: DC | PRN

## 2021-11-10 MED ORDER — DIPHENHYDRAMINE HCL 25 MG PO CAPS: 25.0000 mg | ORAL_CAPSULE | Freq: Four times a day (QID) | ORAL | Status: DC | PRN

## 2021-11-10 MED ORDER — DOCUSATE SODIUM 100 MG PO CAPS
100.0000 mg | ORAL_CAPSULE | Freq: Two times a day (BID) | ORAL | Status: DC
  Administered 2021-11-10 – 2021-11-12 (×4): 100 mg via ORAL
  Filled 2021-11-10 (×4): qty 1

## 2021-11-10 MED ORDER — MELATONIN 3 MG PO TABS: 3.0000 mg | ORAL_TABLET | Freq: Every evening | ORAL | Status: DC | PRN

## 2021-11-10 MED ORDER — SODIUM CHLORIDE 0.9 % IV BOLUS
1000.0000 mL | Freq: Once | INTRAVENOUS | Status: AC
  Administered 2021-11-10: 1000 mL via INTRAVENOUS

## 2021-11-10 NOTE — ED Triage Notes (Signed)
Pt stated that she had 3 episodes of emesis at 0300 and thought she had blood in same.

## 2021-11-10 NOTE — Discharge Instructions (Signed)
[facility] LAPAROSCOPIC SURGERY: POST OP INSTRUCTIONS Always review your discharge instruction sheet given to you by the facility where your surgery was performed. IF YOU HAVE DISABILITY OR FAMILY LEAVE FORMS, YOU MUST BRING THEM TO THE OFFICE FOR PROCESSING.   DO NOT GIVE THEM TO YOUR DOCTOR.  PAIN CONTROL  First take acetaminophen (Tylenol) AND/or ibuprofen (Advil) to control your pain after surgery.  Follow directions on package.  Taking acetaminophen (Tylenol) and/or ibuprofen (Advil) regularly after surgery will help to control your pain and lower the amount of prescription pain medication you may need.  You should not take more than 3,000 mg (3 grams) of acetaminophen (Tylenol) in 24 hours.  You should not take ibuprofen (Advil), aleve, motrin, naprosyn or other NSAIDS if you have a history of stomach ulcers or chronic kidney disease.  A prescription for pain medication may be given to you upon discharge.  Take your pain medication as prescribed, if you still have uncontrolled pain after taking acetaminophen (Tylenol) or ibuprofen (Advil). Use ice packs to help control pain. If you need a refill on your pain medication, please contact your pharmacy.  They will contact our office to request authorization. Prescriptions will not be filled after 5pm or on week-ends.  HOME MEDICATIONS Take your usually prescribed medications unless otherwise directed.  DIET You should follow a light diet the first few days after arrival home.  Be sure to include lots of fluids daily. Avoid fatty, fried foods.   CONSTIPATION It is common to experience some constipation after surgery and if you are taking pain medication.  Increasing fluid intake and taking a stool softener (such as Colace) will usually help or prevent this problem from occurring.  A mild laxative (Milk of Magnesia or Miralax) should be taken according to package instructions if there are no bowel movements after 48  hours.  WOUND/INCISION CARE Most patients will experience some swelling and bruising in the area of the incisions.  Ice packs will help.  Swelling and bruising can take several days to resolve.  Unless discharge instructions indicate otherwise, follow guidelines below  STERI-STRIPS - you may remove your outer bandages 48 hours after surgery, and you may shower at that time.  You have steri-strips (small skin tapes) in place directly over the incision.  These strips should be left on the skin for 7-10 days.   DERMABOND/SKIN GLUE - you may shower in 24 hours.  The glue will flake off over the next 2-3 weeks. Any sutures or staples will be removed at the office during your follow-up visit.  ACTIVITIES You may resume regular (light) daily activities beginning the next day--such as daily self-care, walking, climbing stairs--gradually increasing activities as tolerated.  You may have sexual intercourse when it is comfortable.  Refrain from any heavy lifting or straining until approved by your doctor. You may drive when you are no longer taking prescription pain medication, you can comfortably wear a seatbelt, and you can safely maneuver your car and apply brakes.  FOLLOW-UP You should see your doctor in the office for a follow-up appointment approximately 2-3 weeks after your surgery.  You should have been given your post-op/follow-up appointment when your surgery was scheduled.  If you did not receive a post-op/follow-up appointment, make sure that you call for this appointment within a day or two after you arrive home to insure a convenient appointment time.   WHEN TO CALL YOUR DOCTOR: Fever over 101.0 Inability to urinate Continued bleeding from incision.   Increased pain, redness, or drainage from the incision. Increasing abdominal pain  The clinic staff is available to answer your questions during regular business hours.  Please don't hesitate to call and ask to speak to one of the nurses for  clinical concerns.  If you have a medical emergency, go to the nearest emergency room or call 911.  A surgeon from [facility] is always on call at the hospital. [address], [address], [place], [place]  [address] ? [address] [address], [place], [place]   [address] [phone] ? [phone] ? FAX [phone] Web site: [email or web address]      Managing Your Pain After Surgery Without Opioids    Thank you for participating in our program to help patients manage their pain after surgery without opioids. This is part of our effort to provide you with the best care possible, without exposing you or your family to the risk that opioids pose.  What pain can I expect after surgery? You can expect to have some pain after surgery. This is normal. The pain is typically worse the day after surgery, and quickly begins to get better. Many studies have found that many patients are able to manage their pain after surgery with Over-the-Counter (OTC) medications such as Tylenol and Motrin. If you have a condition that does not allow you to take Tylenol or Motrin, notify your surgical team.  How will I manage my pain? The best strategy for controlling your pain after surgery is around the clock pain control with Tylenol (acetaminophen) and Motrin (ibuprofen or Advil). Alternating these medications with each other allows you to maximize your pain control. In addition to Tylenol and Motrin, you can use heating pads or ice packs on your incisions to help reduce your pain.  How will I alternate your regular strength over-the-counter pain medication? You will take a dose of pain medication every three hours. Start by taking 650 mg of Tylenol (2 pills of 325 mg) 3 hours later take 600 mg of Motrin (3 pills of 200 mg) 3 hours after taking the Motrin take 650 mg of Tylenol 3 hours after that take 600 mg of Motrin.   - 1 -  See example - if your first dose of Tylenol is at 12:00  PM   12:00 PM Tylenol 650 mg (2 pills of 325 mg)  3:00 PM Motrin 600 mg (3 pills of 200 mg)  6:00 PM Tylenol 650 mg (2 pills of 325 mg)  9:00 PM Motrin 600 mg (3 pills of 200 mg)  Continue alternating every 3 hours   We recommend that you follow this schedule around-the-clock for at least 3 days after surgery, or until you feel that it is no longer needed. Use the table on the last page of this handout to keep track of the medications you are taking. Important: Do not take more than 3000mg of Tylenol or 3200mg of Motrin in a 24-hour period. Do not take ibuprofen/Motrin if you have a history of bleeding stomach ulcers, severe kidney disease, &/or actively taking a blood thinner  What if I still have pain? If you have pain that is not controlled with the over-the-counter pain medications (Tylenol and Motrin or Advil) you might have what we call "breakthrough" pain. You will receive a prescription for a small amount of an opioid pain medication such as Oxycodone, Tramadol, or Tylenol with Codeine. Use these opioid pills in the first 24 hours after surgery if you have breakthrough pain. Do   not take more than 1 pill every 4-6 hours.  If you still have uncontrolled pain after using all opioid pills, don't hesitate to call our staff using the number provided. We will help make sure you are managing your pain in the best way possible, and if necessary, we can provide a prescription for additional pain medication.   Day 1    Time  Name of Medication Number of pills taken  Amount of Acetaminophen  Pain Level   Comments  AM PM       AM PM       AM PM       AM PM       AM PM       AM PM       AM PM       AM PM       Total Daily amount of Acetaminophen Do not take more than  3,000 mg per day      Day 2    Time  Name of Medication Number of pills taken  Amount of Acetaminophen  Pain Level   Comments  AM PM       AM PM       AM PM       AM PM       AM PM       AM PM       AM  PM       AM PM       Total Daily amount of Acetaminophen Do not take more than  3,000 mg per day      Day 3    Time  Name of Medication Number of pills taken  Amount of Acetaminophen  Pain Level   Comments  AM PM       AM PM       AM PM       AM PM          AM PM       AM PM       AM PM       AM PM       Total Daily amount of Acetaminophen Do not take more than  3,000 mg per day      Day 4    Time  Name of Medication Number of pills taken  Amount of Acetaminophen  Pain Level   Comments  AM PM       AM PM       AM PM       AM PM       AM PM       AM PM       AM PM       AM PM       Total Daily amount of Acetaminophen Do not take more than  3,000 mg per day      Day 5    Time  Name of Medication Number of pills taken  Amount of Acetaminophen  Pain Level   Comments  AM PM       AM PM       AM PM       AM PM       AM PM       AM PM       AM PM       AM PM       Total Daily amount of Acetaminophen Do not take more than    3,000 mg per day       Day 6    Time  Name of Medication Number of pills taken  Amount of Acetaminophen  Pain Level  Comments  AM PM       AM PM       AM PM       AM PM       AM PM       AM PM       AM PM       AM PM       Total Daily amount of Acetaminophen Do not take more than  3,000 mg per day      Day 7    Time  Name of Medication Number of pills taken  Amount of Acetaminophen  Pain Level   Comments  AM PM       AM PM       AM PM       AM PM       AM PM       AM PM       AM PM       AM PM       Total Daily amount of Acetaminophen Do not take more than  3,000 mg per day        For additional information about how and where to safely dispose of unused opioid medications - https://www.morepowerfulnc.org  Disclaimer: This document contains information and/or instructional materials adapted from [facility] for the typical patient with your condition. It does not replace medical advice  from your health care provider because your experience may differ from that of the typical patient. Talk to your health care provider if you have any questions about this document, your condition or your treatment plan. Adapted from [facility]  

## 2021-11-10 NOTE — ED Notes (Signed)
Report called to Charge RN at [facility] 

## 2021-11-10 NOTE — H&P (Addendum)
History and Physical  Annette Jones 15-Mar-1986  [id number].    Requesting MD: Dr. Billy Fischer Chief Complaint/Reason for Consult: Acute calculus cholecystitis  HPI:  36 year old female with medical history significant for borderline type II DM who presented to [facility] emergency department due to abdominal pain, nausea, emesis.  Symptoms began yesterday evening after a superbowl party where she had consumed 1.5 glasses of wine and fried food. Pain was initially severe, located in her upper abdomen, worst in the epigastrium and RUQ with some radiation to her back. No hx of similar pain in the past. Denies associated fevers. Pain has significantly improved since presentation but still notes mild to moderate upper abdominal pain.   Work-up in the ED significant for patient hypertensive otherwise VSS, afebrile, LFTs WNL, WBC normal, lipase < 10.  Right upper quadrant ultrasound with moderately distended gallbladder with 2 large stones at gallbladder neck  Substance use: Occasional alcohol use Allergies: Percocet, itching (does okay with hydrocodone) Blood thinners: None Past Surgeries: No prior abdominal surgeries  ROS: Review of Systems  Constitutional:  Negative for chills and fever.  Respiratory:  Negative for cough, shortness of breath and wheezing.   Cardiovascular:  Negative for chest pain, palpitations and leg swelling.  Gastrointestinal:  Positive for abdominal pain, constipation (last bm yesterday, passing flatus), nausea and vomiting. Negative for diarrhea.  All other systems reviewed and are negative.  Family History  Problem Relation Age of Onset   Hypertension Mother    Diabetes Maternal Grandmother    Hypertension Maternal Grandmother    Hypertension Cousin    Other Neg Hx     Past Medical History:  Diagnosis Date   Borderline diabetes    Cellulitis of lower leg 2012   "left"   Chlamydia 2009   CHLAMYDIA   Elevated hemoglobin A1c 04/14/2012   5.9 at [facility]  interview--plan early glucola at 18 weeks.    Infection    UTI X 1   Urinary tract infection    Yeast infection    frequent    Past Surgical History:  Procedure Laterality Date   INDUCED ABORTION      Social History:  reports that she has never smoked. She has never used smokeless tobacco. She reports current alcohol use of about 9.0 standard drinks per week. She reports that she does not use drugs.  Allergies:  Allergies  Allergen Reactions   Percocet [Oxycodone-Acetaminophen] Itching    (Not in a hospital admission)   Blood pressure (!) 167/115, pulse 73, temperature 98.3 F (36.8 C), temperature source Oral, resp. rate 18, height 5\' 5"  (1.651 m), weight 117.9 kg, last menstrual period 10/29/2021, SpO2 99 %. Physical Exam: General: pleasant, WD, female who is laying in bed in NAD HEENT: head is normocephalic, atraumatic.  Sclera are noninjected.  Pupils equal and round. EOMs intact.  Ears and nose without any masses or lesions.  Mouth is pink and moist Heart: regular, rate, and rhythm.  Normal s1,s2. No obvious murmurs, gallops, or rubs noted.  Palpable radial and pedal pulses bilaterally Lungs: CTAB, no wheezes, rhonchi, or rales noted.  Respiratory effort nonlabored Abd: soft, ND, +BS, no masses, hernias, or organomegaly.  Tenderness to palpation in epigastrium and right upper quadrant without rebound or guarding MSK: all 4 extremities are symmetrical with no cyanosis, clubbing, or edema. Skin: warm and dry with no masses, lesions, or rashes Neuro: Cranial nerves 2-12 grossly intact, sensation is normal throughout Psych: A&Ox3 with an appropriate affect.  Results for orders placed or performed during the hospital encounter of 11/10/21 (from the past 48 hour(s))  Comprehensive metabolic panel     Status: Abnormal   Collection Time: 11/10/21  8:11 AM  Result Value Ref Range   Sodium 136 135 - 145 mmol/L   Potassium 3.8 3.5 - 5.1 mmol/L   Chloride 104 98 - 111 mmol/L    CO2 25 22 - 32 mmol/L   Glucose, Bld 110 (H) 70 - 99 mg/dL    Comment: Glucose reference range applies only to samples taken after fasting for at least 8 hours.   BUN 12 6 - 20 mg/dL   Creatinine, Ser 1.06 (H) 0.44 - 1.00 mg/dL   Calcium 9.1 8.9 - 10.3 mg/dL   Total Protein 7.6 6.5 - 8.1 g/dL   Albumin 4.2 3.5 - 5.0 g/dL   AST 15 15 - 41 U/L   ALT 10 0 - 44 U/L   Alkaline Phosphatase 71 38 - 126 U/L   Total Bilirubin 0.3 0.3 - 1.2 mg/dL   GFR, Estimated >60 >60 mL/min    Comment: (NOTE) Calculated using the CKD-EPI Creatinine Equation (2021)    Anion gap 7 5 - 15    Comment: Performed at [facility], [address], [place], [place] [address]  CBC     Status: Abnormal   Collection Time: 11/10/21  8:11 AM  Result Value Ref Range   WBC 6.6 4.0 - 10.5 K/uL   RBC 4.28 3.87 - 5.11 MIL/uL   Hemoglobin 11.5 (L) 12.0 - 15.0 g/dL   HCT 36.1 36.0 - 46.0 %   MCV 84.3 80.0 - 100.0 fL   MCH 26.9 26.0 - 34.0 pg   MCHC 31.9 30.0 - 36.0 g/dL   RDW 12.8 11.5 - 15.5 %   Platelets 302 150 - 400 K/uL   nRBC 0.0 0.0 - 0.2 %    Comment: Performed at [facility], [address] [place], [place] [address]  Lipase, blood     Status: Abnormal   Collection Time: 11/10/21  8:11 AM  Result Value Ref Range   Lipase <10 (L) 11 - 51 U/L    Comment: Performed at [facility], [address], [place], [place] [address]  hCG, serum, qualitative     Status: None   Collection Time: 11/10/21  8:11 AM  Result Value Ref Range   Preg, Serum NEGATIVE NEGATIVE    Comment:        THE SENSITIVITY OF THIS METHODOLOGY IS >10 mIU/mL. Performed at [facility], [address], [place], [place] [address]    US Abdomen Limited RUQ (LIVER/GB)  Result Date: 11/10/2021 CLINICAL DATA:  Right upper quadrant pain, nausea EXAM: ULTRASOUND ABDOMEN LIMITED RIGHT UPPER QUADRANT COMPARISON:  CT 10/06/2015 FINDINGS: Gallbladder: Moderately  distended gallbladder containing 2 adjacent stones at the gallbladder neck measuring up to 2.1 cm in diameter. No wall thickening visualized. Sonographer did not indicate the presence or absence of a Murphy sign. Common bile duct: Diameter: 5.9 mm. Liver: No focal lesion identified. Within normal limits in parenchymal echogenicity. Portal vein is patent on color Doppler imaging with normal direction of blood flow towards the liver. Other: None. IMPRESSION: Moderately distended gallbladder containing 2 large stones at the gallbladder neck. No gallbladder wall thickening is evident. Findings are equivocal for acute cholecystitis. If further imaging evaluation is warranted clinically, a nuclear medicine hepatobiliary scan may be helpful to assess the patency of the cystic duct and  common bile duct. Electronically Signed   By: Davina Poke D.O.   On: 11/10/2021 09:04      Assessment/Plan Symptomatic Cholelithiasis with possible early Acute Cholecystitis  - Afebrile, WBC normal, LFTs WNL, right upper quadrant ultrasound with "Moderately distended gallbladder containing 2 large stones at the gallbladder neck. No gallbladder wall thickening is evident. Findings are equivocal for acute cholecystitis". She still is with mild tenderness on exam.  - We discussed operative vs non-operative intervention/treatment.  I have explained the procedure, risks, and aftercare of Laparoscopic cholecystectomy.  Risks include but are not limited to anesthesia (MI, CVA, death), bleeding, infection, wound problems, hernia, bile leak, injury to common bile duct/liver/intestine and diarrhea post op.  She seems to understand and would like to proceed with surgery. Admit to observation. Possible d/c from PACU. We also discussed there is a possibility her case may need to be delayed to tomorrow if other emergency cases present.   Addendum: Due to OR availability, will plan for OR on 2/14. Patient can have CLD and will be made NPO at  midnight. She is agreeable to this plan.   FEN: NPO for OR VTE: SCDs, Lovenox ID: Rocephin peri-op  HTN - No hx or home medications. Denies CP or SOB. EKG done in ED prior to transfer. PRN medications ordered. I have consulted [facility] consult. She does have a PCP at [facility].   This care required moderate level of medical decision making.   Alferd Apa, [facility] Surgery 11/10/2021, 11:26 AM Please see Amion for pager number during day hours 7:00am-4:30pm

## 2021-11-10 NOTE — ED Provider Notes (Signed)
Pt transferred to [facility] ED to see general surgery.  Pt states she is pain free now.  BP notably elevated.  Pt denies taking medications for high blood pressure.  States it occurs when she is nervous.  Will recheck, monitor.   Linwood Dibbles, MD 11/10/21 1251

## 2021-11-10 NOTE — ED Triage Notes (Signed)
Pt ED to ED transfer from [facility] for cholecystitis, hypertensive upon arrival

## 2021-11-10 NOTE — ED Notes (Signed)
Obtained consent for procedure 

## 2021-11-10 NOTE — Assessment & Plan Note (Addendum)
-  Patient presented with complaints of abdominal pain with nausea and vomiting.  Ultrasound of the abdomen noted moderately distended gallbladder with 2 large gallstones at the gallbladder neck which findings were equivocal for acute cholecystitis.  She had been started on Rocephin IV empirically. S/p cholecystectomy

## 2021-11-10 NOTE — Assessment & Plan Note (Addendum)
-  Upon admission into the emergency department patient was seen to have blood pressure was elevated up to 182/121.  At baseline patient reports not being on any medication for treatment.  Family history is significant for her father having hypertension treated on losartan.  Suspect symptoms are likely multifactorial in nature in the setting of significant pain with acute cholecystitis, reports of anxiety, and likely underlying hypertension. Continue Losartan, would favor low dose at 25 mg upon discharge. PCP follow up in a week for recheck BP

## 2021-11-10 NOTE — Assessment & Plan Note (Addendum)
Chronic.  Renal insufficiency creatinine 1.06 which appears near patient's baseline. -Continue to monitor

## 2021-11-10 NOTE — Consult Note (Addendum)
Initial Consultation Note   Patient: Annette Jones EUM:[id number] DOB: 08-30-1986 PCP: [name] DOA: 11/10/2021 DOS: the patient was seen and examined on 11/10/2021 Primary service: Montez Morita, Md, MD  Referring physician: Surgery Reason for consult: Hypertensive urgency  Assessment/Plan: Assessment and Plan: * Symptomatic cholelithiasis with possible early acute cholecystitis- (present on admission) Patient presented with complaints of abdominal pain with nausea and vomiting.  Ultrasound of the abdomen noted moderately distended gallbladder with 2 large gallstones at the gallbladder neck which findings were equivocal for acute cholecystitis.  She had been started on Rocephin IV empirically.  General surgery admitted the patient with plans to take to operating room in a.m. for a laparoscopic cholecystectomy. - Per general surgery  Hypertensive urgency- (present on admission) Upon admission into the emergency department patient was seen to have blood pressure was elevated up to 182/121.  At baseline patient reports not being on any medication for treatment.  Family history is significant for her father having hypertension treated on losartan.  Suspect symptoms are likely multifactorial in nature in the setting of significant pain with acute cholecystitis, reports of anxiety, and likely underlying hypertension. -Recommend pain control -Start losartan 50 mg daily.  Normally patient would be recommended to start on 2 agents, but would recommend against this in the acute setting due to other factors including anxiety and/or pain -Continue hydralazine IV as needed for elevated blood pressure -Recommend patient follow-up in the outpatient setting with her primary care provider for continued management  Renal insufficiency Chronic.  Renal insufficiency creatinine 1.06 which appears near patient's baseline. -Continue to monitor  Normocytic anemia- (present on admission) Chronic.   On admission hemoglobin 11.5 g/dL which appears around patient's baseline.  Obesity, Class III, BMI 40-49.9 (morbid obesity) (HCC)- (present on admission) On admission BMI noted to be 43.27 kg/m -Recommend dietary and lifestyle modifications which can also lead to improvements in blood pressure       [facility] will continue to follow the patient.  HPI: Annette Jones is a 36 y.o. female with past medical history of prediabetes and morbid obesity who presents with complaints of epigastric abdominal pain with nausea and vomiting since yesterday evening.  She had had some wine and fried food at a [employer] party prior to onset of symptoms.  Normally patient reports that her blood pressures are not elevated unless she is in a significant amount of pain or anxious coming into the hospital.  She does have family history significant of her father having elevated blood pressure, but is reported to be currently well controlled on losartan.  In the emergency department patient was noted to have blood pressures elevated up to 182/121, and all other vital signs maintained.  Labs noted hemoglobin 11.5, BUN 12, creatinine 1.06, LFTs within normal limits, and lipase <10.  Ultrasound of the abdomen noted moderately distended gallbladder containing 2 large stones at the gallbladder neck equivalent to acute cholecystitis.  Urinalysis appeared to be contaminated.  Review of Systems: As mentioned in the history of present illness. All other systems reviewed and are negative. Past Medical History:  Diagnosis Date   Borderline diabetes    Cellulitis of lower leg 2012   "left"   Chlamydia 2009   CHLAMYDIA   Elevated hemoglobin A1c 04/14/2012   5.9 at NOB interview--plan early glucola at 18 weeks.    Infection    UTI X 1   Urinary tract infection    Yeast infection    frequent   Past Surgical  History:  Procedure Laterality Date   INDUCED ABORTION     Social History:  reports that she has never smoked. She has  never used smokeless tobacco. She reports current alcohol use of about 9.0 standard drinks per week. She reports that she does not use drugs.  Allergies  Allergen Reactions   Percocet [Oxycodone-Acetaminophen] Itching    Family History  Problem Relation Age of Onset   Hypertension Mother    Diabetes Maternal Grandmother    Hypertension Maternal Grandmother    Hypertension Cousin    Other Neg Hx     Prior to Admission medications   Medication Sig Start Date End Date Taking? Authorizing Provider  amoxicillin-clavulanate (AUGMENTIN) 875-125 MG tablet Take 1 tablet by mouth every 12 (twelve) hours. 12/15/17   Belinda Fisher, PA-C  levonorgestrel (MIRENA) 20 MCG/24HR IUD 1 each by Intrauterine route once.    [provider]  meloxicam (MOBIC) 7.5 MG tablet Take 1 tablet (7.5 mg total) by mouth daily. 12/15/17   Cathie Hoops, Amy V, PA-C  mupirocin ointment (BACTROBAN) 2 % Apply 1 application topically 2 (two) times daily. 12/15/17   Cathie Hoops, Amy V, PA-C  terbinafine (LAMISIL) 250 MG tablet TAKE 1 TABLET BY MOUTH ONCE DAILY 08/27/17   Hyatt, Max T, DPM  terbinafine (LAMISIL) 250 MG tablet Take 1 tablet (250 mg total) by mouth daily. 09/09/17   Elinor Parkinson, DPM    Physical Exam: Vitals:   11/10/21 1051 11/10/21 1130 11/10/21 1151 11/10/21 1339  BP: (!) 167/115 (!) 168/111  (!) 182/121  Pulse: 73 62  78  Resp: 18   16  Temp:   97.7 F (36.5 C) 97.8 F (36.6 C)  TempSrc:   Oral Oral  SpO2: 99% 99%  99%  Weight:      Height:       Exam  Constitutional: Female who appears to be in no acute distress at this time Eyes: PERRL, lids and conjunctivae normal ENMT: Mucous membranes are dry.  Posterior pharynx clear of any exudate or lesions.  Neck: normal, supple, no masses, no thyromegaly Respiratory: clear to auscultation bilaterally, no wheezing, no crackles. Normal respiratory effort. No accessory muscle use.  Cardiovascular: Regular rate and rhythm, no murmurs / rubs / gallops. No extremity  edema.   Abdomen: no tenderness appreciated at this time bowel sounds present all 4 quadrants. Psychiatric: Normal judgment and insight. Alert and oriented x 3.  Mildly anxious mood.    Data Reviewed:    EKG noted normal sinus rhythm with sinus arrhythmia noted at 80 bpm. Family Communication: Family updated at bedside Primary team communication:  Thank you very much for involving [place] in the care of your patient.  Author: Clydie Braun, MD 11/10/2021 2:20 PM  For on call review [email or web address].

## 2021-11-10 NOTE — Assessment & Plan Note (Addendum)
-  Chronic.  On admission hemoglobin 11.5 g/dL which appears around patient's baseline.

## 2021-11-10 NOTE — Assessment & Plan Note (Addendum)
-  On admission BMI noted to be 43.27 kg/m. Recommend dietary and lifestyle modifications which can also lead to improvements in blood pressure

## 2021-11-10 NOTE — ED Notes (Signed)
Pt is undressed and all belongings sent with pt to short stay in belonging bag.

## 2021-11-10 NOTE — ED Notes (Signed)
Short stay RN called this RN and informed me that pt will need to stay in ER. Pt will have surgery tomorrow. So orders for bed will be placed. Pt has been informed

## 2021-11-10 NOTE — ED Notes (Signed)
Notified Joe RN of pt's BP being elevated

## 2021-11-10 NOTE — ED Notes (Signed)
Pt resting in bed. Arrives as transfer from [facility] for possible gallbladder surgery.

## 2021-11-10 NOTE — ED Provider Notes (Signed)
[place] EMERGENCY DEPT Provider Note   CSN: [id number] Arrival date & time: 11/10/21  0758     History  Chief Complaint  Patient presents with   Abdominal Pain    Annette Jones is a 36 y.o. female.  HPI     36yo female with history of borderline DM presents with concern for abdominal pain, nausea and vomiting.   Reports that yesterday she did not eat all day because she was planning on going to a [employer] party, and after eating at the [employer] party she felt uncomfortably full, however was able to go to sleep at midnight.  At 5 AM, she woke up with severe abdominal cramping.  Describes it as a cramping and contraction-like pain radiating across the middle of her abdomen.  Reports she has not had a bowel movement since yesterday.  She attempted to have a bowel movement to see if that would help her with pain but was not able to.  Denies any diarrhea, black or bloody stools.  Noted that when she vomited there was some bright red blood that was mixed in with other food she had had.  Drank wine last night, denies NSAID use.  No history of GI bleed, pancreatitis, abdominal surgery or cholelithiasis.  Denies dysuria, hematuria, although notes that she was treated for a UTI about 2 weeks ago at which time she did have hematuria noted at her physical.  Denies vaginal bleeding or discharge. Did have some flank pain on the right side. Pain has improved since vomiting this AM.    Past Medical History:  Diagnosis Date   Borderline diabetes    Cellulitis of lower leg 2012   "left"   Chlamydia 2009   CHLAMYDIA   Elevated hemoglobin A1c 04/14/2012   5.9 at NOB interview--plan early glucola at 18 weeks.    Infection    UTI X 1   Urinary tract infection    Yeast infection    frequent    Home Medications Prior to Admission medications   Medication Sig Start Date End Date Taking? Authorizing Provider  amoxicillin-clavulanate (AUGMENTIN) 875-125 MG tablet Take 1 tablet  by mouth every 12 (twelve) hours. 12/15/17   Ok Edwards, PA-C  levonorgestrel (MIRENA) 20 MCG/24HR IUD 1 each by Intrauterine route once.    [provider]  meloxicam (MOBIC) 7.5 MG tablet Take 1 tablet (7.5 mg total) by mouth daily. 12/15/17   Tasia Catchings, Amy V, PA-C  mupirocin ointment (BACTROBAN) 2 % Apply 1 application topically 2 (two) times daily. 12/15/17   Tasia Catchings, Amy V, PA-C  terbinafine (LAMISIL) 250 MG tablet TAKE 1 TABLET BY MOUTH ONCE DAILY 08/27/17   Hyatt, Max T, DPM  terbinafine (LAMISIL) 250 MG tablet Take 1 tablet (250 mg total) by mouth daily. 09/09/17   Hyatt, Max T, DPM      Allergies    Percocet [oxycodone-acetaminophen]    Review of Systems   Review of Systems  Physical Exam Updated Vital Signs BP (!) 174/111    Pulse 74    Temp 98.3 F (36.8 C) (Oral)    Resp 16    Ht 5\' 5"  (1.651 m)    Wt 117.9 kg    LMP 10/29/2021 (Approximate)    SpO2 100%    BMI 43.27 kg/m  Physical Exam Vitals and nursing note reviewed.  Constitutional:      General: She is not in acute distress.    Appearance: She is well-developed. She is not diaphoretic.  HENT:     Head: Normocephalic and atraumatic.  Eyes:     Conjunctiva/sclera: Conjunctivae normal.  Cardiovascular:     Rate and Rhythm: Normal rate and regular rhythm.     Heart sounds: Normal heart sounds. No murmur heard.   No friction rub. No gallop.  Pulmonary:     Effort: Pulmonary effort is normal. No respiratory distress.     Breath sounds: Normal breath sounds. No wheezing or rales.  Abdominal:     General: There is no distension.     Palpations: Abdomen is soft.     Tenderness: There is abdominal tenderness in the right upper quadrant. There is no right CVA tenderness or guarding. Negative signs include Murphy's sign.  Musculoskeletal:        General: No tenderness.     Cervical back: Normal range of motion.  Skin:    General: Skin is warm and dry.     Findings: No erythema or rash.  Neurological:     Mental Status:  She is alert and oriented to person, place, and time.    ED Results / Procedures / Treatments   Labs (all labs ordered are listed, but only abnormal results are displayed) Labs Reviewed  COMPREHENSIVE METABOLIC PANEL - Abnormal; Notable for the following components:      Result Value   Glucose, Bld 110 (*)    Creatinine, Ser 1.06 (*)    All other components within normal limits  CBC - Abnormal; Notable for the following components:   Hemoglobin 11.5 (*)    All other components within normal limits  LIPASE, BLOOD - Abnormal; Notable for the following components:   Lipase <10 (*)    All other components within normal limits  RESP PANEL BY RT-PCR (FLU A&B, COVID) ARPGX2  HCG, SERUM, QUALITATIVE  URINALYSIS, ROUTINE W REFLEX MICROSCOPIC  DIFFERENTIAL    EKG None  Radiology US Abdomen Limited RUQ (LIVER/GB)  Result Date: 11/10/2021 CLINICAL DATA:  Right upper quadrant pain, nausea EXAM: ULTRASOUND ABDOMEN LIMITED RIGHT UPPER QUADRANT COMPARISON:  CT 10/06/2015 FINDINGS: Gallbladder: Moderately distended gallbladder containing 2 adjacent stones at the gallbladder neck measuring up to 2.1 cm in diameter. No wall thickening visualized. Sonographer did not indicate the presence or absence of a Murphy sign. Common bile duct: Diameter: 5.9 mm. Liver: No focal lesion identified. Within normal limits in parenchymal echogenicity. Portal vein is patent on color Doppler imaging with normal direction of blood flow towards the liver. Other: None. IMPRESSION: Moderately distended gallbladder containing 2 large stones at the gallbladder neck. No gallbladder wall thickening is evident. Findings are equivocal for acute cholecystitis. If further imaging evaluation is warranted clinically, a nuclear medicine hepatobiliary scan may be helpful to assess the patency of the cystic duct and common bile duct. Electronically Signed   By: Duanne Guess D.O.   On: 11/10/2021 09:04    Procedures Procedures     Medications Ordered in ED Medications  sodium chloride 0.9 % bolus 1,000 mL (1,000 mLs Intravenous New Bag/Given 11/10/21 0941)  ondansetron (ZOFRAN) injection 4 mg (4 mg Intravenous Given 11/10/21 6861)    ED Course/ Medical Decision Making/ A&P                           Medical Decision Making Amount and/or Complexity of Data Reviewed Labs: ordered. Radiology: ordered.  Risk Prescription drug management.    36yo female with history of borderline DM presents with concern for abdominal pain,  nausea and vomiting.   DDx includes appendicitis, pancreatitis, cholecystitis, pyelonephritis, nephrolithiasis, diverticulitis, gastritis, PUD, PID, ovarian torsion, ectopic pregnancy, and tuboovarian abscess  Not having lower abdominal pain, low suspicion for PID, torsion, TOA. Pregnancy test negative.  Labs personally interpreted by me show normal renal function, normal transaminases, no sign pancreatitis, pregnancy test negative. She did drop UA in the toilet and will need to be recollected but is not having urinary symptoms.   Exam and history less consistent with appendicitis, diverticulitis, nephrolithiasis. Describes small amount of blood in emesis, is hemodynamically stable, no melena or BM and have low suspicion for clinically significant GI bleed. Hgb at baseline.    RUQ US shows moderately distended gallbladder with 2 large gallbladder neck stones, equivocal for acute cholecystitis.  DDx continues to include early cholecystitis, cholelithiasis, less likely gastritis/gastroenteritis, pyelonephritis.  Highest clinical suspicion for gallbladder pathology by exam, history.   Discussed with Dr. Redmond Pulling, will transfer to [facility] ED for evaluation.         Final Clinical Impression(s) / ED Diagnoses Final diagnoses:  RUQ abdominal tenderness  Calculus of gallbladder without cholecystitis without obstruction    Rx / DC Orders ED Discharge Orders     None          Gareth Morgan, MD 11/10/21 1027

## 2021-11-11 ENCOUNTER — Encounter (HOSPITAL_COMMUNITY): Payer: Self-pay

## 2021-11-11 ENCOUNTER — Other Ambulatory Visit: Payer: Self-pay

## 2021-11-11 ENCOUNTER — Observation Stay (HOSPITAL_COMMUNITY): Payer: 59 | Admitting: Certified Registered"

## 2021-11-11 ENCOUNTER — Observation Stay (HOSPITAL_BASED_OUTPATIENT_CLINIC_OR_DEPARTMENT_OTHER): Payer: 59 | Admitting: Certified Registered"

## 2021-11-11 ENCOUNTER — Encounter (HOSPITAL_COMMUNITY)
Admission: EM | Disposition: A | Discharge status: Home / Self Care | Payer: Self-pay | Source: Home / Self Care | Attending: Emergency Medicine

## 2021-11-11 DIAGNOSIS — D649 Anemia, unspecified: Secondary | ICD-10-CM | POA: Diagnosis not present

## 2021-11-11 DIAGNOSIS — I10 Essential (primary) hypertension: Secondary | ICD-10-CM | POA: Diagnosis not present

## 2021-11-11 DIAGNOSIS — K8070 Calculus of gallbladder and bile duct without cholecystitis without obstruction: Secondary | ICD-10-CM | POA: Diagnosis not present

## 2021-11-11 HISTORY — PX: CHOLECYSTECTOMY: SHX55

## 2021-11-11 LAB — CBC
HCT: 36 % (ref 36.0–46.0)
Hemoglobin: 11.6 g/dL — ABNORMAL LOW (ref 12.0–15.0)
MCH: 27.3 pg (ref 26.0–34.0)
MCHC: 32.2 g/dL (ref 30.0–36.0)
MCV: 84.7 fL (ref 80.0–100.0)
Platelets: 291 10*3/uL (ref 150–400)
RBC: 4.25 MIL/uL (ref 3.87–5.11)
RDW: 12.7 % (ref 11.5–15.5)
WBC: 5.2 10*3/uL (ref 4.0–10.5)
nRBC: 0 % (ref 0.0–0.2)

## 2021-11-11 LAB — COMPREHENSIVE METABOLIC PANEL
ALT: 12 U/L (ref 0–44)
AST: 17 U/L (ref 15–41)
Albumin: 3.5 g/dL (ref 3.5–5.0)
Alkaline Phosphatase: 58 U/L (ref 38–126)
Anion gap: 7 (ref 5–15)
BUN: 5 mg/dL — ABNORMAL LOW (ref 6–20)
CO2: 24 mmol/L (ref 22–32)
Calcium: 9 mg/dL (ref 8.9–10.3)
Chloride: 106 mmol/L (ref 98–111)
Creatinine, Ser: 1.07 mg/dL — ABNORMAL HIGH (ref 0.44–1.00)
GFR, Estimated: >60 mL/min (ref >60)
Glucose, Bld: 120 mg/dL — ABNORMAL HIGH (ref 70–99)
Potassium: 3.5 mmol/L (ref 3.5–5.1)
Sodium: 137 mmol/L (ref 135–145)
Total Bilirubin: 0.4 mg/dL (ref 0.3–1.2)
Total Protein: 6.7 g/dL (ref 6.5–8.1)

## 2021-11-11 LAB — HIV ANTIBODY (ROUTINE TESTING W REFLEX): HIV Screen 4th Generation wRfx: NONREACTIVE

## 2021-11-11 LAB — SURGICAL PCR SCREEN
MRSA, PCR: NEGATIVE
Staphylococcus aureus: NEGATIVE

## 2021-11-11 LAB — GLUCOSE, CAPILLARY: Glucose-Capillary: 158 mg/dL — ABNORMAL HIGH (ref 70–99)

## 2021-11-11 SURGERY — LAPAROSCOPIC CHOLECYSTECTOMY
Anesthesia: General | Site: Abdomen

## 2021-11-11 MED ORDER — MIDAZOLAM HCL 2 MG/2ML IJ SOLN
INTRAMUSCULAR | Status: DC | PRN
Start: 2021-11-11 — End: 2021-11-11
  Administered 2021-11-11: 2 mg via INTRAVENOUS

## 2021-11-11 MED ORDER — PROPOFOL 10 MG/ML IV BOLUS
INTRAVENOUS | Status: DC | PRN
  Administered 2021-11-11: 200 mg via INTRAVENOUS

## 2021-11-11 MED ORDER — PROMETHAZINE HCL 25 MG/ML IJ SOLN: 6.2500 mg | INTRAMUSCULAR | Status: DC | PRN

## 2021-11-11 MED ORDER — BUPIVACAINE-EPINEPHRINE (PF) 0.25% -1:200000 IJ SOLN
INTRAMUSCULAR | Status: AC
  Filled 2021-11-11: qty 30

## 2021-11-11 MED ORDER — ORAL CARE MOUTH RINSE: 15.0000 mL | Freq: Once | OROMUCOSAL | Status: AC

## 2021-11-11 MED ORDER — BUPIVACAINE-EPINEPHRINE 0.25% -1:200000 IJ SOLN
INTRAMUSCULAR | Status: DC | PRN
Start: 2021-11-11 — End: 2021-11-11
  Administered 2021-11-11: 15 mL

## 2021-11-11 MED ORDER — LACTATED RINGERS IV SOLN: INTRAVENOUS | Status: DC

## 2021-11-11 MED ORDER — MUPIROCIN 2 % EX OINT
1.0000 "application " | TOPICAL_OINTMENT | Freq: Two times a day (BID) | CUTANEOUS | Status: DC
  Administered 2021-11-11 – 2021-11-12 (×2): 1 via NASAL
  Filled 2021-11-11: qty 22

## 2021-11-11 MED ORDER — DEXAMETHASONE SODIUM PHOSPHATE 10 MG/ML IJ SOLN
INTRAMUSCULAR | Status: DC | PRN
Start: 2021-11-11 — End: 2021-11-11
  Administered 2021-11-11: 5 mg via INTRAVENOUS

## 2021-11-11 MED ORDER — CHLORHEXIDINE GLUCONATE 0.12 % MT SOLN: 15.0000 mL | Freq: Once | OROMUCOSAL | Status: AC

## 2021-11-11 MED ORDER — FENTANYL CITRATE (PF) 100 MCG/2ML IJ SOLN: 25.0000 ug | INTRAMUSCULAR | Status: DC | PRN

## 2021-11-11 MED ORDER — SODIUM CHLORIDE 0.9 % IR SOLN
Status: DC | PRN
Start: 2021-11-11 — End: 2021-11-11
  Administered 2021-11-11: 1000 mL

## 2021-11-11 MED ORDER — MIDAZOLAM HCL 2 MG/2ML IJ SOLN
INTRAMUSCULAR | Status: AC
  Filled 2021-11-11: qty 2

## 2021-11-11 MED ORDER — 0.9 % SODIUM CHLORIDE (POUR BTL) OPTIME
TOPICAL | Status: DC | PRN
  Administered 2021-11-11: 1000 mL

## 2021-11-11 MED ORDER — CHLORHEXIDINE GLUCONATE 0.12 % MT SOLN
OROMUCOSAL | Status: AC
  Administered 2021-11-11: 15 mL via OROMUCOSAL
  Filled 2021-11-11: qty 15

## 2021-11-11 MED ORDER — LIDOCAINE 2% (20 MG/ML) 5 ML SYRINGE
INTRAMUSCULAR | Status: DC | PRN
Start: 2021-11-11 — End: 2021-11-11
  Administered 2021-11-11: 60 mg via INTRAVENOUS

## 2021-11-11 MED ORDER — CELECOXIB 200 MG PO CAPS
ORAL_CAPSULE | ORAL | Status: AC
  Administered 2021-11-11: 200 mg via ORAL
  Filled 2021-11-11: qty 1

## 2021-11-11 MED ORDER — ACETAMINOPHEN 500 MG PO TABS
ORAL_TABLET | ORAL | Status: AC
  Administered 2021-11-11: 1000 mg via ORAL
  Filled 2021-11-11: qty 2

## 2021-11-11 MED ORDER — FENTANYL CITRATE (PF) 250 MCG/5ML IJ SOLN
INTRAMUSCULAR | Status: AC
  Filled 2021-11-11: qty 5

## 2021-11-11 MED ORDER — ONDANSETRON HCL 4 MG/2ML IJ SOLN
INTRAMUSCULAR | Status: DC | PRN
Start: 2021-11-11 — End: 2021-11-11
  Administered 2021-11-11: 4 mg via INTRAVENOUS

## 2021-11-11 MED ORDER — DEXMEDETOMIDINE (PRECEDEX) IN NS 20 MCG/5ML (4 MCG/ML) IV SYRINGE
PREFILLED_SYRINGE | INTRAVENOUS | Status: DC | PRN
  Administered 2021-11-11: 8 ug via INTRAVENOUS

## 2021-11-11 MED ORDER — FENTANYL CITRATE (PF) 250 MCG/5ML IJ SOLN
INTRAMUSCULAR | Status: DC | PRN
  Administered 2021-11-11: 50 ug via INTRAVENOUS
  Administered 2021-11-11 (×2): 100 ug via INTRAVENOUS

## 2021-11-11 MED ORDER — PHENYLEPHRINE HCL (PRESSORS) 10 MG/ML IV SOLN
INTRAVENOUS | Status: DC | PRN
  Administered 2021-11-11: 80 ug via INTRAVENOUS
  Administered 2021-11-11: 8 ug via INTRAVENOUS

## 2021-11-11 MED ORDER — SODIUM CHLORIDE 0.9 % IV SOLN: INTRAVENOUS | Status: DC

## 2021-11-11 MED ORDER — SUGAMMADEX SODIUM 200 MG/2ML IV SOLN
INTRAVENOUS | Status: DC | PRN
  Administered 2021-11-11: 400 mg via INTRAVENOUS

## 2021-11-11 MED ORDER — PROPOFOL 10 MG/ML IV BOLUS
INTRAVENOUS | Status: AC
  Filled 2021-11-11: qty 20

## 2021-11-11 MED ORDER — KETOROLAC TROMETHAMINE 30 MG/ML IJ SOLN
INTRAMUSCULAR | Status: DC | PRN
  Administered 2021-11-11: 30 mg via INTRAVENOUS

## 2021-11-11 MED ORDER — ROCURONIUM BROMIDE 10 MG/ML (PF) SYRINGE
PREFILLED_SYRINGE | INTRAVENOUS | Status: DC | PRN
  Administered 2021-11-11: 70 mg via INTRAVENOUS

## 2021-11-11 MED ORDER — ACETAMINOPHEN 500 MG PO TABS: 1000.0000 mg | ORAL_TABLET | Freq: Once | ORAL | Status: AC

## 2021-11-11 MED ORDER — CELECOXIB 200 MG PO CAPS: 200.0000 mg | ORAL_CAPSULE | Freq: Once | ORAL | Status: AC

## 2021-11-11 MED ORDER — HYDRALAZINE HCL 20 MG/ML IJ SOLN: 10.0000 mg | INTRAMUSCULAR | Status: DC | PRN

## 2021-11-11 SURGICAL SUPPLY — 40 items
APPLIER CLIP 5 13 M/L LIGAMAX5 (MISCELLANEOUS) ×2
BAG COUNTER SPONGE SURGICOUNT (BAG) ×2 IMPLANT
BENZOIN TINCTURE PRP APPL 2/3 (GAUZE/BANDAGES/DRESSINGS) ×2 IMPLANT
BLADE CLIPPER SURG (BLADE) IMPLANT
BNDG ADH 1X3 SHEER STRL LF (GAUZE/BANDAGES/DRESSINGS) ×6 IMPLANT
CANISTER SUCT 3000ML PPV (MISCELLANEOUS) ×2 IMPLANT
CHLORAPREP W/TINT 26 (MISCELLANEOUS) ×2 IMPLANT
CLIP APPLIE 5 13 M/L LIGAMAX5 (MISCELLANEOUS) ×1 IMPLANT
COVER SURGICAL LIGHT HANDLE (MISCELLANEOUS) ×2 IMPLANT
DERMABOND ADHESIVE PROPEN (GAUZE/BANDAGES/DRESSINGS) ×1
DERMABOND ADVANCED .7 DNX6 (GAUZE/BANDAGES/DRESSINGS) IMPLANT
ELECT REM PT RETURN 9FT ADLT (ELECTROSURGICAL) ×2
ELECTRODE REM PT RTRN 9FT ADLT (ELECTROSURGICAL) ×1 IMPLANT
GAUZE SPONGE 2X2 8PLY STRL LF (GAUZE/BANDAGES/DRESSINGS) ×1 IMPLANT
GLOVE SURG ENC TEXT LTX SZ7.5 (GLOVE) ×2 IMPLANT
GLOVE SURG UNDER LTX SZ8 (GLOVE) ×4 IMPLANT
GOWN STRL REUS W/ TWL LRG LVL3 (GOWN DISPOSABLE) ×2 IMPLANT
GOWN STRL REUS W/TWL 2XL LVL3 (GOWN DISPOSABLE) ×2 IMPLANT
GOWN STRL REUS W/TWL LRG LVL3 (GOWN DISPOSABLE) ×4
GRASPER SUT TROCAR 14GX15 (MISCELLANEOUS) IMPLANT
KIT BASIN OR (CUSTOM PROCEDURE TRAY) ×2 IMPLANT
KIT TURNOVER KIT B (KITS) ×2 IMPLANT
NS IRRIG 1000ML POUR BTL (IV SOLUTION) ×2 IMPLANT
PAD ARMBOARD 7.5X6 YLW CONV (MISCELLANEOUS) ×2 IMPLANT
POUCH RETRIEVAL ECOSAC 10 (ENDOMECHANICALS) ×1 IMPLANT
POUCH RETRIEVAL ECOSAC 10MM (ENDOMECHANICALS) ×1
SCISSORS LAP 5X35 DISP (ENDOMECHANICALS) ×2 IMPLANT
SET IRRIG TUBING LAPAROSCOPIC (IRRIGATION / IRRIGATOR) ×2 IMPLANT
SET TUBE SMOKE EVAC HIGH FLOW (TUBING) ×2 IMPLANT
SLEEVE ENDOPATH XCEL 5M (ENDOMECHANICALS) ×4 IMPLANT
SPECIMEN JAR SMALL (MISCELLANEOUS) ×2 IMPLANT
SPONGE GAUZE 2X2 STER 10/PKG (GAUZE/BANDAGES/DRESSINGS) ×1
SUT MNCRL AB 4-0 PS2 18 (SUTURE) ×2 IMPLANT
SUT VICRYL 0 UR6 27IN ABS (SUTURE) IMPLANT
TOWEL GREEN STERILE (TOWEL DISPOSABLE) ×2 IMPLANT
TOWEL GREEN STERILE FF (TOWEL DISPOSABLE) ×2 IMPLANT
TRAY LAPAROSCOPIC MC (CUSTOM PROCEDURE TRAY) ×2 IMPLANT
TROCAR XCEL BLUNT TIP 100MML (ENDOMECHANICALS) ×2 IMPLANT
TROCAR XCEL NON-BLD 5MMX100MML (ENDOMECHANICALS) ×2 IMPLANT
WATER STERILE IRR 1000ML POUR (IV SOLUTION) ×2 IMPLANT

## 2021-11-11 NOTE — Interval H&P Note (Signed)
History and Physical Interval Note:  11/11/2021 10:03 AM  Annette Jones  has presented today for surgery, with the diagnosis of Acute Cholecystitis.  The various methods of treatment have been discussed with the patient and family. After consideration of risks, benefits and other options for treatment, the patient has consented to  Procedure(s): LAPAROSCOPIC CHOLECYSTECTOMY WITH ICG (N/A) as a surgical intervention.  The patient's history has been reviewed, patient examined, no change in status, stable for surgery.  I have reviewed the patient's chart and labs.  Questions were answered to the patient's satisfaction.    I believe the patient's symptoms are consistent with gallbladder disease.  We discussed gallbladder disease.   I discussed laparoscopic cholecystectomy with poss IOC in detail.  The patient was shown diagrams detailing the procedure.  We discussed the risks and benefits of a laparoscopic cholecystectomy including, but not limited to bleeding, infection, injury to surrounding structures such as the intestine or liver, bile leak, retained gallstones, need to convert to an open procedure, prolonged diarrhea, blood clots such as  DVT, common bile duct injury, anesthesia risks, and possible need for additional procedures.  We discussed the typical post-operative recovery course. I explained that the likelihood of improvement of their symptoms is good.   Greer Pickerel

## 2021-11-11 NOTE — Progress Notes (Signed)
Consultation Progress Note   Patient: Annette Jones BHA:[id number] DOB: 1986-02-05 DOA: 11/10/2021 DOS: the patient was seen and examined on 11/11/2021 Primary service: Ccs, Md, MD  Brief hospital course: Yanni Ruberg is a 36 y.o. female with past medical history of prediabetes and morbid obesity who presents with complaints of epigastric abdominal pain with nausea and vomiting since yesterday evening.  She had had some wine and fried food at a [employer] party prior to onset of symptoms.  Normally patient reports that her blood pressures are not elevated unless she is in a significant amount of pain or anxious coming into the hospital.  [facility] consulted for BP monitoring.  Assessment and Plan: * Symptomatic cholelithiasis with possible early acute cholecystitis- (present on admission) Patient presented with complaints of abdominal pain with nausea and vomiting.  Ultrasound of the abdomen noted moderately distended gallbladder with 2 large gallstones at the gallbladder neck which findings were equivocal for acute cholecystitis.  She had been started on Rocephin IV empirically. -in the OR currently  Normocytic anemia- (present on admission) Chronic.  On admission hemoglobin 11.5 g/dL which appears around patient's baseline.  Hypertensive urgency- (present on admission) Upon admission into the emergency department patient was seen to have blood pressure was elevated up to 182/121.  At baseline patient reports not being on any medication for treatment.  Family history is significant for her father having hypertension treated on losartan.  Suspect symptoms are likely multifactorial in nature in the setting of significant pain with acute cholecystitis, reports of anxiety, and likely underlying hypertension. -Recommend pain control -Start losartan 50 mg daily.   -Continue hydralazine IV as needed for elevated blood pressure -Recommend patient follow-up in the outpatient setting with her primary care  provider for continued management vs addition of another agent after surgery if still elevated  Obesity, Class III, BMI 40-49.9 (morbid obesity) (HCC)- (present on admission) On admission BMI noted to be 43.27 kg/m -Recommend dietary and lifestyle modifications which can also lead to improvements in blood pressure      [facility] will continue to follow the patient.  Subjective: in the OR  Physical Exam: Vitals:   11/10/21 2346 11/11/21 0428 11/11/21 0718 11/11/21 0813  BP: (!) 159/115 (!) 161/119 (!) 147/100 (!) 155/113  Pulse: 88 79 (!) 102 (!) 109  Resp: 18 18 16 18   Temp: 98.1 F (36.7 C) 98.1 F (36.7 C) 98.2 F (36.8 C) 97.9 F (36.6 C)  TempSrc: Oral Oral Oral Oral  SpO2: 98% 100% 99% 100%  Weight:      Height:       In the OR  Data Reviewed: Labs stable     Time spent: 25 minutes.  Author: , DO 11/11/2021 8:44 AM  For on call review [email or web address].

## 2021-11-11 NOTE — Plan of Care (Signed)

## 2021-11-11 NOTE — Hospital Course (Signed)
Annette Jones is a 36 y.o. female with past medical history of prediabetes and morbid obesity who presents with complaints of epigastric abdominal pain with nausea and vomiting since yesterday evening.  She had had some wine and fried food at a [employer] party prior to onset of symptoms.  Normally patient reports that her blood pressures are not elevated unless she is in a significant amount of pain or anxious coming into the hospital.  [facility] consulted for BP monitoring.

## 2021-11-11 NOTE — Progress Notes (Signed)
BP Recheck after surgery shows a BMP much improve.  Will leave on single agent and patient can follow up with PCP after discharge for further adjustment if needed Vitals:   11/11/21 1156 11/11/21 1211 11/11/21 1216 11/11/21 1238  BP: 124/80 127/81 119/82 136/84  Pulse: 83 80 78 77  Resp: (!) 22 (!) 23 (!) 22 18  Temp:   98.3 F (36.8 C) 97.8 F (36.6 C)  TempSrc:    Oral  SpO2: 98% 98% 98% 100%  Weight:      Height:

## 2021-11-11 NOTE — Anesthesia Procedure Notes (Signed)
Procedure Name: Intubation Date/Time: 11/11/2021 10:15 AM Performed by: Carolan Clines, CRNA Pre-anesthesia Checklist: Patient identified, Emergency Drugs available, Suction available and Patient being monitored Patient Re-evaluated:Patient Re-evaluated prior to induction Oxygen Delivery Method: Circle system utilized Preoxygenation: Pre-oxygenation with 100% oxygen Induction Type: IV induction Ventilation: Mask ventilation without difficulty Laryngoscope Size: Mac and 3 Grade View: Grade I Tube type: Oral Tube size: 7.0 mm Number of attempts: 1 Airway Equipment and Method: Stylet and Oral airway Placement Confirmation: ETT inserted through vocal cords under direct vision, positive ETCO2 and breath sounds checked- equal and bilateral Secured at: 22 cm Tube secured with: Tape Dental Injury: Teeth and Oropharynx as per pre-operative assessment

## 2021-11-11 NOTE — Transfer of Care (Signed)
Immediate Anesthesia Transfer of Care Note  Patient: Annette Jones  Procedure(s) Performed: LAPAROSCOPIC CHOLECYSTECTOMY WITH ICG (Abdomen)  Patient Location: PACU  Anesthesia Type:General  Level of Consciousness: drowsy and responds to stimulation  Airway & Oxygen Therapy: Patient Spontanous Breathing and Patient connected to face mask oxygen  Post-op Assessment: Report given to RN and Post -op Vital signs reviewed and stable  Post vital signs: Reviewed and stable  Last Vitals:  Vitals Value Taken Time  BP 124/77 11/11/21 1125  Temp    Pulse 96 11/11/21 1126  Resp 22 11/11/21 1126  SpO2 100 % 11/11/21 1126  Vitals shown include unvalidated device data.  Last Pain:  Vitals:   11/11/21 0847  TempSrc:   PainSc: 0-No pain      Patients Stated Pain Goal: 0 (11/10/21 2128)  Complications: No notable events documented.

## 2021-11-11 NOTE — Op Note (Signed)
Annette Jones [id number] 1985/11/09 11/11/2021  Laparoscopic Cholecystectomy with ICG dye immunofluorescence procedure Note  Indications: This patient presents with symptomatic gallbladder disease and will undergo laparoscopic cholecystectomy.  Pre-operative Diagnosis: Symptomatic cholelithiasis possible early acute calculus cholecystitis  Post-operative Diagnosis: Same  Surgeon: Gaynelle Adu MD FACS  Assistants: none  Anesthesia: General endotracheal anesthesia  Procedure Details  The patient was seen again in the Holding Room. The risks, benefits, complications, treatment options, and expected outcomes were discussed with the patient. The possibilities of reaction to medication, pulmonary aspiration, perforation of viscus, bleeding, recurrent infection, finding a normal gallbladder, the need for additional procedures, failure to diagnose a condition, the possible need to convert to an open procedure, and creating a complication requiring transfusion or operation were discussed with the patient. The likelihood of improving the patient's symptoms with return to their baseline status is good.  The patient and/or family concurred with the proposed plan, giving informed consent. The site of surgery properly noted. The patient was taken to Operating Room, identified as Annette Jones and the procedure verified as Laparoscopic Cholecystectomy with Intraoperative Cholangiogram. A Time Out was held and the above information confirmed. Antibiotic prophylaxis was administered.   Prior to the induction of general anesthesia, antibiotic prophylaxis was administered. General endotracheal anesthesia was then administered and tolerated well. After the induction, the abdomen was prepped with Chloraprep and draped in the sterile fashion. The patient was positioned in the supine position.  Local anesthetic agent was injected into the skin near the umbilicus and an incision made. We dissected down to the  abdominal fascia with blunt dissection.  The fascia was incised vertically and we entered the peritoneal cavity bluntly.  A pursestring suture of 0-Vicryl was placed around the fascial opening.  The Hasson cannula was inserted and secured with the stay suture.  Pneumoperitoneum was then created with CO2 and tolerated well without any adverse changes in the patient's vital signs. An 5-mm port was placed in the subxiphoid position.  Two 5-mm ports were placed in the right upper quadrant. All skin incisions were infiltrated with a local anesthetic agent before making the incision and placing the trocars.   We positioned the patient in reverse Trendelenburg, tilted slightly to the patient's left.  The gallbladder was identified, the fundus grasped and retracted cephalad. Adhesions were lysed bluntly and with the electrocautery where indicated, taking care not to injure any adjacent organs or viscus. The infundibulum was grasped and retracted laterally, exposing the peritoneum overlying the triangle of Calot. This was then divided and exposed in a blunt fashion. A critical view of the cystic duct and cystic artery was obtained.  Near infrared immunofluorescence was activated.  There is visualization and immunofluorescent activity seen within the gallbladder, cystic duct as well as the common bile duct.  We had clearly identified the cystic duct.  There were no other structures entering the gallbladder other than the cystic artery  The cystic duct was then ligated with clips and divided. The cystic artery which had been identified & dissected free was ligated with clips and divided as well.   The gallbladder was dissected from the liver bed in retrograde fashion with the electrocautery. The gallbladder was removed and placed in an Ecco sac.  The gallbladder and Ecco sac were then removed through the umbilical port site. The liver bed was irrigated and inspected. Hemostasis was achieved with the electrocautery.  Copious irrigation was utilized and was repeatedly aspirated until clear.  The pursestring suture was used to close  the umbilical fascia.  I placed an additional interrupted 0 Vicryl at the umbilical fascia using the PMI suture passer with laparoscopic guidance.  Additional local was infiltrated.  We again inspected the right upper quadrant for hemostasis.  The umbilical closure was inspected and there was no air leak and nothing trapped within the closure. Pneumoperitoneum was released as we removed the trocars.  4-0 Monocryl was used to close the skin.   Dermabond was applied. The patient was then extubated and brought to the recovery room in stable condition. Instrument, sponge, and needle counts were correct at closure and at the conclusion of the case.   Findings: +critical view Immunofluorescence activity seen within the cystic duct and common bile duct.  Estimated Blood Loss: Minimal         Drains: none         Specimens: Gallbladder           Complications: None; patient tolerated the procedure well.         Disposition: PACU - hemodynamically stable.         Condition: stable  Mary Sella. Andrey Campanile, MD, FACS General, Bariatric, & Minimally Invasive Surgery [facility], [place]

## 2021-11-11 NOTE — Anesthesia Preprocedure Evaluation (Addendum)
Anesthesia Evaluation  Patient identified by MRN, date of birth, ID band Patient awake    Reviewed: Allergy & Precautions, NPO status , Patient's Chart, lab work & pertinent test results  History of Anesthesia Complications Negative for: history of anesthetic complications  Airway Mallampati: II  TM Distance: >3 FB Neck ROM: Full    Dental  (+) Dental Advisory Given   Pulmonary neg pulmonary ROS,    Pulmonary exam normal        Cardiovascular hypertension (not on any meds currently), Normal cardiovascular exam     Neuro/Psych negative neurological ROS  negative psych ROS   GI/Hepatic negative GI ROS, Neg liver ROS,   Endo/Other  Morbid obesity Borderline DM   Renal/GU negative Renal ROS     Musculoskeletal negative musculoskeletal ROS (+)   Abdominal   Peds  Hematology  (+) Blood dyscrasia, anemia ,   Anesthesia Other Findings   Reproductive/Obstetrics                           Anesthesia Physical Anesthesia Plan  ASA: 3  Anesthesia Plan: General   Post-op Pain Management: Celebrex PO (pre-op)* and Tylenol PO (pre-op)*   Induction: Intravenous  PONV Risk Score and Plan: 4 or greater and Treatment may vary due to age or medical condition, Ondansetron, Dexamethasone, Midazolam and Scopolamine patch - Pre-op  Airway Management Planned: Oral ETT  Additional Equipment: None  Intra-op Plan:   Post-operative Plan: Extubation in OR  Informed Consent: I have reviewed the patients History and Physical, chart, labs and discussed the procedure including the risks, benefits and alternatives for the proposed anesthesia with the patient or authorized representative who has indicated his/her understanding and acceptance.     Dental advisory given  Plan Discussed with: CRNA and Anesthesiologist  Anesthesia Plan Comments:        Anesthesia Quick Evaluation

## 2021-11-11 NOTE — Anesthesia Postprocedure Evaluation (Signed)
Anesthesia Post Note  Patient: Annette Jones  Procedure(s) Performed: LAPAROSCOPIC CHOLECYSTECTOMY WITH ICG (Abdomen)     Patient location during evaluation: PACU Anesthesia Type: General Level of consciousness: awake and alert Pain management: pain level controlled Vital Signs Assessment: post-procedure vital signs reviewed and stable Respiratory status: spontaneous breathing, nonlabored ventilation and respiratory function stable Cardiovascular status: stable and blood pressure returned to baseline Anesthetic complications: no   No notable events documented.  Last Vitals:  Vitals:   11/11/21 1216 11/11/21 1238  BP: 119/82 (!) 142/89  Pulse: 78   Resp: (!) 22   Temp: 36.8 C   SpO2: 98%     Last Pain:  Vitals:   11/11/21 1126  TempSrc:   PainSc: Asleep                 [name]

## 2021-11-12 ENCOUNTER — Encounter (HOSPITAL_COMMUNITY): Payer: Self-pay | Admitting: General Surgery

## 2021-11-12 DIAGNOSIS — K8020 Calculus of gallbladder without cholecystitis without obstruction: Secondary | ICD-10-CM | POA: Diagnosis not present

## 2021-11-12 LAB — SURGICAL PATHOLOGY

## 2021-11-12 MED ORDER — LOSARTAN POTASSIUM 25 MG PO TABS
25.0000 mg | ORAL_TABLET | Freq: Every day | ORAL | Status: DC
  Administered 2021-11-12: 25 mg via ORAL
  Filled 2021-11-12: qty 1

## 2021-11-12 MED ORDER — IBUPROFEN 600 MG PO TABS
600.0000 mg | ORAL_TABLET | Freq: Three times a day (TID) | ORAL | 0 refills | Status: AC | PRN
Start: 2021-11-12 — End: 2021-11-17

## 2021-11-12 MED ORDER — METHOCARBAMOL 500 MG PO TABS: 500.0000 mg | ORAL_TABLET | Freq: Four times a day (QID) | ORAL | 0 refills | Status: DC | PRN

## 2021-11-12 MED ORDER — HYDROCODONE-ACETAMINOPHEN 5-325 MG PO TABS: 1.0000 | ORAL_TABLET | Freq: Four times a day (QID) | ORAL | 0 refills | Status: DC | PRN

## 2021-11-12 MED ORDER — LOSARTAN POTASSIUM 25 MG PO TABS: 25.0000 mg | ORAL_TABLET | Freq: Every day | ORAL | 0 refills | Status: DC

## 2021-11-12 NOTE — Progress Notes (Signed)
PROGRESS NOTE  Annette Jones [id number] DOB: [birth year] DOA: 11/10/2021 PCP: [facility]   LOS: 0 days   Brief Narrative / Interim history: Annette Jones is a 36 y.o. female with past medical history of prediabetes and morbid obesity who presents with complaints of epigastric abdominal pain with nausea and vomiting since yesterday evening.  She had had some wine and fried food at a [employer] party prior to onset of symptoms.  Normally patient reports that her blood pressures are not elevated unless she is in a significant amount of pain or anxious coming into the hospital.  [facility] consulted for BP monitoring.  Subjective / 24h Interval events: No complaints this morning.   Assessment and Plan: * Symptomatic cholelithiasis with possible early acute cholecystitis- (present on admission) -Patient presented with complaints of abdominal pain with nausea and vomiting.  Ultrasound of the abdomen noted moderately distended gallbladder with 2 large gallstones at the gallbladder neck which findings were equivocal for acute cholecystitis.  She had been started on Rocephin IV empirically. S/p cholecystectomy  Hypertensive urgency- (present on admission) -Upon admission into the emergency department patient was seen to have blood pressure was elevated up to 182/121.  At baseline patient reports not being on any medication for treatment.  Family history is significant for her father having hypertension treated on losartan.  Suspect symptoms are likely multifactorial in nature in the setting of significant pain with acute cholecystitis, reports of anxiety, and likely underlying hypertension. Continue Losartan, would favor low dose at 25 mg upon discharge. PCP follow up in a week for recheck BP  Normocytic anemia- (present on admission) -Chronic.  On admission hemoglobin 11.5 g/dL which appears around patient's baseline.  Obesity, Class III, BMI 40-49.9 (morbid obesity) ([place])-  (present on admission) -On admission BMI noted to be 43.27 kg/m. Recommend dietary and lifestyle modifications which can also lead to improvements in blood pressure    Scheduled Meds:  docusate sodium  100 mg Oral BID   enoxaparin (LOVENOX) injection  40 mg Subcutaneous Q24H   losartan  25 mg Oral Daily   mupirocin ointment  1 application Nasal BID   Continuous Infusions:  sodium chloride 75 mL/hr at 11/11/21 2033   PRN Meds:.bisacodyl, diphenhydrAMINE **OR** diphenhydrAMINE, fentaNYL (SUBLIMAZE) injection, hydrALAZINE, HYDROcodone-acetaminophen, ibuprofen, LORazepam, melatonin, methocarbamol, metoprolol tartrate, morphine injection, ondansetron (ZOFRAN) IV, polyethylene glycol, simethicone  Diet Orders (From admission, onward)     Start     Ordered   11/11/21 1927  Diet regular Room service appropriate? Yes; Fluid consistency: Thin  Diet effective now       Question Answer Comment  Room service appropriate? Yes   Fluid consistency: Thin      11/11/21 1927            DVT prophylaxis: enoxaparin (LOVENOX) injection 40 mg Start: 11/10/21 1415 SCDs Start: 11/10/21 1409   Lab Results  Component Value Date   PLT 291 11/11/2021      Code Status: Full Code   Level of care: Med-Surg  Procedures:  Cholecystectomy 2/14  Microbiology  none  Antimicrobials: Ceftriaxone    Objective: Vitals:   11/11/21 1646 11/11/21 1943 11/12/21 0057 11/12/21 0559  BP: (!) 139/98 (!) 131/95 128/81   Pulse: 95 99 (!) 104 83  Resp: 18 18 18 18   Temp: 98.8 F (37.1 C) 98.5 F (36.9 C) 98.4 F (36.9 C) 98.4 F (36.9 C)  TempSrc: Oral Oral Oral Oral  SpO2: 98% 97% 97% 98%  Weight:  Height:        Intake/Output Summary (Last 24 hours) at 11/12/2021 0719 Last data filed at 11/11/2021 1628 Gross per 24 hour  Intake 252.85 ml  Output --  Net 252.85 ml   Wt Readings from Last 3 Encounters:  11/10/21 117.9 kg  05/09/19 113.4 kg  02/02/16 106.1 kg     Examination:  Constitutional: NAD Eyes: no scleral icterus ENMT: Mucous membranes are moist.  Neck: normal, supple Respiratory: clear to auscultation bilaterally, no wheezing, no crackles. Normal respiratory effort. Cardiovascular: Regular rate and rhythm, no murmurs / rubs / gallops.  Abdomen: non distended, no tenderness. Bowel sounds positive.  Musculoskeletal: no clubbing / cyanosis.  Skin: no rashes Neurologic: non focal    Data Reviewed: I have independently reviewed following labs and imaging studies   CBC Recent Labs  Lab 11/10/21 0811 11/11/21 0504  WBC 6.6 5.2  HGB 11.5* 11.6*  HCT 36.1 36.0  PLT 302 291  MCV 84.3 84.7  MCH 26.9 27.3  MCHC 31.9 32.2  RDW 12.8 12.7    Recent Labs  Lab 11/10/21 0811 11/11/21 0504  NA 136 137  K 3.8 3.5  CL 104 106  CO2 25 24  GLUCOSE 110* 120*  BUN 12 5*  CREATININE 1.06* 1.07*  CALCIUM 9.1 9.0  AST 15 17  ALT 10 12  ALKPHOS 71 58  BILITOT 0.3 0.4  ALBUMIN 4.2 3.5    ------------------------------------------------------------------------------------------------------------------ No results for input(s): CHOL, HDL, LDLCALC, TRIG, CHOLHDL, LDLDIRECT in the last 72 hours.  Lab Results  Component Value Date   HGBA1C 5.9 (H) 08/06/2015   ------------------------------------------------------------------------------------------------------------------ No results for input(s): TSH, T4TOTAL, T3FREE, THYROIDAB in the last 72 hours.  Invalid input(s): FREET3  Cardiac Enzymes No results for input(s): CKMB, TROPONINI, MYOGLOBIN in the last 168 hours.  Invalid input(s): CK ------------------------------------------------------------------------------------------------------------------ No results found for: BNP  CBG: Recent Labs  Lab 11/11/21 0814  GLUCAP 158*    Recent Results (from the past 240 hour(s))  Resp Panel by RT-PCR (Flu A&B, Covid) Nasopharyngeal Swab     Status: None   Collection Time:  11/10/21 10:50 AM   Specimen: Nasopharyngeal Swab; Nasopharyngeal(NP) swabs in vial transport medium  Result Value Ref Range Status   SARS Coronavirus 2 by RT PCR NEGATIVE NEGATIVE Final    Comment: (NOTE) SARS-CoV-2 target nucleic acids are NOT DETECTED.  The SARS-CoV-2 RNA is generally detectable in upper respiratory specimens during the acute phase of infection. The lowest concentration of SARS-CoV-2 viral copies this assay can detect is 138 copies/mL. A negative result does not preclude SARS-Cov-2 infection and should not be used as the sole basis for treatment or other patient management decisions. A negative result may occur with  improper specimen collection/handling, submission of specimen other than nasopharyngeal swab, presence of viral mutation(s) within the areas targeted by this assay, and inadequate number of viral copies(<138 copies/mL). A negative result must be combined with clinical observations, patient history, and epidemiological information. The expected result is Negative.  Fact Sheet for Patients:  EntrepreneurPulse.com.au  Fact Sheet for Healthcare Providers:  IncredibleEmployment.be  This test is no t yet approved or cleared by the [place] FDA and  has been authorized for detection and/or diagnosis of SARS-CoV-2 by FDA under an Emergency Use Authorization (EUA). This EUA will remain  in effect (meaning this test can be used) for the duration of the COVID-19 declaration under Section 564(b)(1) of the Act, 21 U.S.C.section 360bbb-3(b)(1), unless the authorization is terminated  or revoked  sooner.       Influenza A by PCR NEGATIVE NEGATIVE Final   Influenza B by PCR NEGATIVE NEGATIVE Final    Comment: (NOTE) The Xpert Xpress SARS-CoV-2/FLU/RSV plus assay is intended as an aid in the diagnosis of influenza from Nasopharyngeal swab specimens and should not be used as a sole basis for treatment. Nasal washings  and aspirates are unacceptable for Xpert Xpress SARS-CoV-2/FLU/RSV testing.  Fact Sheet for Patients: EntrepreneurPulse.com.au  Fact Sheet for Healthcare Providers: IncredibleEmployment.be  This test is not yet approved or cleared by the [place] FDA and has been authorized for detection and/or diagnosis of SARS-CoV-2 by FDA under an Emergency Use Authorization (EUA). This EUA will remain in effect (meaning this test can be used) for the duration of the COVID-19 declaration under Section 564(b)(1) of the Act, 21 U.S.C. section 360bbb-3(b)(1), unless the authorization is terminated or revoked.  Performed at [facility], [address] [place], [place] [address]   Surgical PCR screen     Status: None   Collection Time: 11/11/21  5:20 AM   Specimen: Nasal Mucosa; Nasal Swab  Result Value Ref Range Status   MRSA, PCR NEGATIVE NEGATIVE Final   Staphylococcus aureus NEGATIVE NEGATIVE Final    Comment: (NOTE) The Xpert SA Assay (FDA approved for NASAL specimens in patients 73 years of age and older), is one component of a comprehensive surveillance program. It is not intended to diagnose infection nor to guide or monitor treatment. Performed at [facility], [place] [address] [place], [place] [address]      Radiology Studies: No results found.   Marzetta Board, MD, PhD [facility]  Between 7 am - 7 pm I am available, please contact me via Amion (for emergencies) or Securechat (non urgent messages)  Between 7 pm - 7 am I am not available, please contact night coverage MD/APP via Amion

## 2021-11-12 NOTE — Plan of Care (Signed)
Pt ready for DC, all DC instructions given and reviewed. Pt verbalized no questions at time of DC.

## 2021-11-12 NOTE — Discharge Summary (Signed)
Patient ID: Annette Jones [id number] August 22, 1986 36 y.o.  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  Symptoms began yesterday evening after a [employer] party where she had consumed 1.5 glasses of wine and fried food. Pain was initially severe, located in her upper abdomen, worst in the epigastrium and RUQ with some radiation to her back. No hx of similar pain in the past. Denies associated fevers. Pain has significantly improved since presentation but still notes mild to moderate upper abdominal pain.    Work-up in the ED significant for patient hypertensive otherwise VSS, afebrile, LFTs WNL, WBC normal, lipase < 10.  Right upper quadrant ultrasound with moderately distended gallbladder with 2 large stones at gallbladder neck   Substance use: Occasional alcohol use Allergies: Percocet, itching (does okay with hydrocodone) Blood thinners: None Past Surgeries: No prior abdominal surgeries  Procedures Dr. Andrey Campanile - Laparoscopic Cholecystectomy - 11/11/2021  Hospital Course:  Patient presented with abdominal pain and was found to have symptomatic cholelithiasis with possible  early acute cholecystitis. She was admitted to the general surgery service. She underwent Laparoscopic cholecystectomy on 2/14 by Dr. Andrey Campanile. She tolerated the procedure well. On POD 1, the patient was voiding well, tolerating diet, ambulating well, pain well controlled, vital signs stable, incisions c/d/i and felt stable for discharge home. Discharge and return precautions discussed.   During admission [facility] was consulted for HTN. Patient without hx of HTN or home medications. She does follow up [facility]/PCP. She was started on Losartan with improvement of BP. [facility] recommended Losartan 25mg  at discharge. She is to follow up with her PCP in 1 week for recheck. Discussed BP checks and recording at home.   Physical Exam: Gen:  Alert, NAD, pleasant Card:  Reg Pulm:  CTAB, no W/R/R, effort normal Abd: Soft, ND, appropriately tender, +BS, Incisions with glue intact appears well and are without drainage, bleeding, or signs of infection Ext:  No LE edema  Psych: A&Ox3  Skin: no rashes noted, warm and dry  Allergies as of 11/12/2021       Reactions   Percocet [oxycodone-acetaminophen] Itching        Medication List     TAKE these medications    Airborne Elderberry Chew Chew 1 tablet by mouth daily.   HYDROcodone-acetaminophen 5-325 MG tablet Commonly known as: NORCO/VICODIN Take 1 tablet by mouth every 6 (six) hours as needed (breakthrough pain).   ibuprofen 600 MG tablet Commonly known as: ADVIL Take 1 tablet (600 mg total) by mouth every 8 (eight) hours as needed for up to 5 days.   levonorgestrel 20 MCG/24HR IUD Commonly known as: MIRENA 1 each by Intrauterine route once.   losartan 25 MG tablet Commonly known as: COZAAR Take 1 tablet (25 mg total) by mouth daily. Start taking  on: November 13, 2021   methocarbamol 500 MG tablet Commonly known as: ROBAXIN Take 1 tablet (500 mg total) by mouth every 6 (six) hours as needed for muscle spasms.          Follow-up  Information     [facility], [facility]. Go on 12/02/2021.   Specialty: General Surgery Why: 10:15 AM. Please arrive 30 min prior to appointment time. Bring photo ID and insurance information. Contact information: [address] [place] [address] [phone]         [name]. Call in 1 day(s).   Why: You were started on blood pressure medication here in the hospital. Please call your pcp to arrange follow up in the next 1 week. Contact information: [address], [address] [place] [address] [phone]                 Signed: Leary Roca, [facility] 11/12/2021, 9:24 AM Please see Amion for pager number during day hours 7:00am-4:30pm

## 2022-05-01 ENCOUNTER — Encounter: Payer: Self-pay | Admitting: Emergency Medicine

## 2022-05-01 ENCOUNTER — Ambulatory Visit
Admission: EM | Admit: 2022-05-01 | Discharge: 2022-05-01 | Disposition: A | Discharge status: Home / Self Care | Payer: 59 | Attending: Urgent Care | Admitting: Urgent Care

## 2022-05-01 DIAGNOSIS — R030 Elevated blood-pressure reading, without diagnosis of hypertension: Secondary | ICD-10-CM

## 2022-05-01 DIAGNOSIS — R052 Subacute cough: Secondary | ICD-10-CM

## 2022-05-01 DIAGNOSIS — J019 Acute sinusitis, unspecified: Secondary | ICD-10-CM | POA: Diagnosis not present

## 2022-05-01 DIAGNOSIS — R0981 Nasal congestion: Secondary | ICD-10-CM | POA: Diagnosis not present

## 2022-05-01 DIAGNOSIS — R070 Pain in throat: Secondary | ICD-10-CM

## 2022-05-01 DIAGNOSIS — I10 Essential (primary) hypertension: Secondary | ICD-10-CM

## 2022-05-01 MED ORDER — LEVOCETIRIZINE DIHYDROCHLORIDE 5 MG PO TABS: 5.0000 mg | ORAL_TABLET | Freq: Every evening | ORAL | 0 refills | Status: DC

## 2022-05-01 MED ORDER — IPRATROPIUM BROMIDE 0.03 % NA SOLN: 2.0000 | Freq: Two times a day (BID) | NASAL | 0 refills | Status: DC

## 2022-05-01 MED ORDER — PSEUDOEPHEDRINE HCL 30 MG PO TABS: 30.0000 mg | ORAL_TABLET | Freq: Three times a day (TID) | ORAL | 0 refills | Status: DC | PRN

## 2022-05-01 MED ORDER — AMOXICILLIN 500 MG PO CAPS: 500.0000 mg | ORAL_CAPSULE | Freq: Three times a day (TID) | ORAL | 0 refills | Status: DC

## 2022-05-01 NOTE — ED Triage Notes (Signed)
Pt here with cough, congestion and scratchy throat x 5 days.

## 2022-05-01 NOTE — ED Provider Notes (Signed)
[facility]   MRN: [id number] DOB: 03-03-1986  Subjective:   Annette Jones is a 36 y.o. female presenting for 5-day history of acute onset scratchy throat, throat pain, postnasal drainage, congestion, coughing, malaise and fatigue.  Has had 1 sick contact with her daughter who was seen and treated for strep with an antibiotic.  Patient reports that her symptoms of her throat are not as bad as her congestion and coughing.  She does have a history of essential hypertension but does not take medications consistently for this.  Has not taken it today.  Denies chest pain, shortness of breath or wheezing.  No current facility-administered medications for this encounter.  Current Outpatient Medications:    HYDROcodone-acetaminophen (NORCO/VICODIN) 5-325 MG tablet, Take 1 tablet by mouth every 6 (six) hours as needed (breakthrough pain)., Disp: 15 tablet, Rfl: 0   levonorgestrel (MIRENA) 20 MCG/24HR IUD, 1 each by Intrauterine route once., Disp: , Rfl:    losartan (COZAAR) 25 MG tablet, Take 1 tablet (25 mg total) by mouth daily., Disp: 30 tablet, Rfl: 0   methocarbamol (ROBAXIN) 500 MG tablet, Take 1 tablet (500 mg total) by mouth every 6 (six) hours as needed for muscle spasms., Disp: 30 tablet, Rfl: 0   Misc Natural Products (AIRBORNE ELDERBERRY) CHEW, Chew 1 tablet by mouth daily., Disp: , Rfl:    Allergies  Allergen Reactions   Percocet [Oxycodone-Acetaminophen] Itching    Past Medical History:  Diagnosis Date   Borderline diabetes    Cellulitis of lower leg 2012   "left"   Chlamydia 2009   CHLAMYDIA   Elevated hemoglobin A1c 04/14/2012   5.9 at NOB interview--plan early glucola at 18 weeks.    Infection    UTI X 1   Urinary tract infection    Yeast infection    frequent     Past Surgical History:  Procedure Laterality Date   CHOLECYSTECTOMY N/A 11/11/2021   Procedure: LAPAROSCOPIC CHOLECYSTECTOMY WITH ICG;  Surgeon: Gaynelle Adu, MD;  Location: [facility] OR;   Service: General;  Laterality: N/A;   INDUCED ABORTION      Family History  Problem Relation Age of Onset   Hypertension Mother    Diabetes Maternal Grandmother    Hypertension Maternal Grandmother    Hypertension Cousin    Other Neg Hx     Social History   Tobacco Use   Smoking status: Never   Smokeless tobacco: Never  Substance Use Topics   Alcohol use: Yes    Alcohol/week: 9.0 standard drinks of alcohol    Types: 2 Shots of liquor, 7 Standard drinks or equivalent per week    Comment: 08/06/2015 "I'll drink some a couple times/month"   Drug use: No    ROS   Objective:   Vitals: BP (!) 164/106   Pulse 74   Temp 98.3 F (36.8 C)   Resp 20   SpO2 98%   Physical Exam Constitutional:      General: She is not in acute distress.    Appearance: Normal appearance. She is well-developed and normal weight. She is not ill-appearing, toxic-appearing or diaphoretic.  HENT:     Head: Normocephalic and atraumatic.     Right Ear: Tympanic membrane, ear canal and external ear normal. No drainage or tenderness. No middle ear effusion. There is no impacted cerumen. Tympanic membrane is not erythematous.     Left Ear: Tympanic membrane, ear canal and external ear normal. No drainage or tenderness.  No middle ear  effusion. There is no impacted cerumen. Tympanic membrane is not erythematous.     Nose: Congestion present. No rhinorrhea.     Mouth/Throat:     Mouth: Mucous membranes are moist. No oral lesions.     Pharynx: Posterior oropharyngeal erythema present. No pharyngeal swelling, oropharyngeal exudate or uvula swelling.     Tonsils: No tonsillar exudate or tonsillar abscesses.     Comments: Thick streaks of postnasal drainage overlying pharynx with associated erythema. Eyes:     General: No scleral icterus.       Right eye: No discharge.        Left eye: No discharge.     Extraocular Movements: Extraocular movements intact.     Right eye: Normal extraocular motion.     Left  eye: Normal extraocular motion.     Conjunctiva/sclera: Conjunctivae normal.  Cardiovascular:     Rate and Rhythm: Normal rate.  Pulmonary:     Effort: Pulmonary effort is normal.  Musculoskeletal:     Cervical back: Normal range of motion and neck supple.  Lymphadenopathy:     Cervical: No cervical adenopathy.  Skin:    General: Skin is warm and dry.  Neurological:     General: No focal deficit present.     Mental Status: She is alert and oriented to person, place, and time.  Psychiatric:        Mood and Affect: Mood normal.        Behavior: Behavior normal.     Assessment and Plan :   PDMP not reviewed this encounter.  1. Acute non-recurrent sinusitis, unspecified location   2. Throat pain   3. Sinus congestion   4. Subacute cough   5. Essential hypertension   6. Elevated blood pressure reading    Will start empiric treatment for sinusitis with amoxicillin.  Recommended supportive care otherwise including the use of oral antihistamine, decongestant only if she can get her blood pressure under better control.  I did review this with her as well including a hypertensive friendly diet and parameters for monitoring her blood pressure.  Emphasized need to take losartan 50 mg once daily and follow-up with PCP.  Counseled patient on potential for adverse effects with medications prescribed/recommended today, ER and return-to-clinic precautions discussed, patient verbalized understanding.    Wallis Bamberg, PA-C 05/01/22 1006

## 2022-05-01 NOTE — Discharge Instructions (Addendum)
If your blood pressure improves from taking your losartan daily then you can use pseudoephedrine to help with your sinuses. Do not take pseudoephedrine if your blood pressure remains above 130/90.   For diabetes or elevated blood sugar, please make sure you are limiting and avoiding starchy, carbohydrate foods like pasta, breads, sweet breads, pastry, rice, potatoes, desserts. These foods can elevate your blood sugar. Also, limit and avoid drinks that contain a lot of sugar such as sodas, sweet teas, fruit juices.  Drinking plain water will be much more helpful, try 64 ounces of water daily.  It is okay to flavor your water naturally by cutting cucumber, lemon, mint or lime, placing it in a picture with water and drinking it over a period of 24-48 hours as long as it remains refrigerated.  For elevated blood pressure, make sure you are monitoring salt in your diet.  Do not eat restaurant foods and limit processed foods at home. I highly recommend you prepare and cook your own foods at home.  Processed foods include things like frozen meals, pre-seasoned meats and dinners, deli meats, canned foods as these foods contain a high amount of sodium/salt.  Make sure you are paying attention to sodium labels on foods you buy at the grocery store. Buy your spices separately such as garlic powder, onion powder, cumin, cayenne, parsley flakes so that you can avoid seasonings that contain salt. However, salt-free seasonings are available and can be used, an example is Mrs. Dash and includes a lot of different mixtures that do not contain salt.  Lastly, when cooking using oils that are healthier for you is important. This includes olive oil, avocado oil, canola oil. We have discussed a lot of foods to avoid but below is a list of foods that can be very healthy to use in your diet whether it is for diabetes, cholesterol, high blood pressure, or in general healthy eating.  Salads - kale, spinach, cabbage, spring mix,  arugula Fruits - avocadoes, berries (blueberries, raspberries, blackberries), apples, oranges, pomegranate, grapefruit, kiwi Vegetables - asparagus, cauliflower, broccoli, green beans, brussel sprouts, bell peppers, beets; stay away from or limit starchy vegetables like potatoes, carrots, peas Other general foods - kidney beans, egg whites, almonds, walnuts, sunflower seeds, pumpkin seeds, fat free yogurt, almond milk, flax seeds, quinoa, oats  Meat - It is better to eat lean meats and limit your red meat including pork to once a week.  Wild caught fish, chicken breast are good options as they tend to be leaner sources of good protein. Still be mindful of the sodium labels for the meats you buy.  DO NOT EAT ANY FOODS ON THIS LIST THAT YOU ARE ALLERGIC TO. For more specific needs, I highly recommend consulting a dietician or nutritionist but this can definitely be a good starting point.

## 2022-07-20 ENCOUNTER — Emergency Department (HOSPITAL_BASED_OUTPATIENT_CLINIC_OR_DEPARTMENT_OTHER)
Admission: EM | Admit: 2022-07-20 | Discharge: 2022-07-20 | Disposition: A | Discharge status: Home / Self Care | Payer: 59 | Attending: Emergency Medicine | Admitting: Emergency Medicine

## 2022-07-20 ENCOUNTER — Encounter (HOSPITAL_BASED_OUTPATIENT_CLINIC_OR_DEPARTMENT_OTHER): Payer: Self-pay | Admitting: *Deleted

## 2022-07-20 ENCOUNTER — Other Ambulatory Visit: Payer: Self-pay

## 2022-07-20 ENCOUNTER — Emergency Department (HOSPITAL_BASED_OUTPATIENT_CLINIC_OR_DEPARTMENT_OTHER): Payer: 59 | Admitting: Radiology

## 2022-07-20 DIAGNOSIS — I10 Essential (primary) hypertension: Secondary | ICD-10-CM

## 2022-07-20 DIAGNOSIS — Z79899 Other long term (current) drug therapy: Secondary | ICD-10-CM | POA: Diagnosis not present

## 2022-07-20 DIAGNOSIS — R079 Chest pain, unspecified: Secondary | ICD-10-CM

## 2022-07-20 DIAGNOSIS — R0789 Other chest pain: Secondary | ICD-10-CM | POA: Diagnosis present

## 2022-07-20 DIAGNOSIS — N3001 Acute cystitis with hematuria: Secondary | ICD-10-CM | POA: Insufficient documentation

## 2022-07-20 LAB — CBC
HCT: 35.5 % — ABNORMAL LOW (ref 36.0–46.0)
Hemoglobin: 11.6 g/dL — ABNORMAL LOW (ref 12.0–15.0)
MCH: 27.8 pg (ref 26.0–34.0)
MCHC: 32.7 g/dL (ref 30.0–36.0)
MCV: 85.1 fL (ref 80.0–100.0)
Platelets: 285 10*3/uL (ref 150–400)
RBC: 4.17 MIL/uL (ref 3.87–5.11)
RDW: 13 % (ref 11.5–15.5)
WBC: 5.1 10*3/uL (ref 4.0–10.5)
nRBC: 0 % (ref 0.0–0.2)

## 2022-07-20 LAB — BASIC METABOLIC PANEL
Anion gap: 9 (ref 5–15)
BUN: 12 mg/dL (ref 6–20)
CO2: 27 mmol/L (ref 22–32)
Calcium: 9.4 mg/dL (ref 8.9–10.3)
Chloride: 101 mmol/L (ref 98–111)
Creatinine, Ser: 1.12 mg/dL — ABNORMAL HIGH (ref 0.44–1.00)
GFR, Estimated: >60 mL/min (ref >60)
Glucose, Bld: 130 mg/dL — ABNORMAL HIGH (ref 70–99)
Potassium: 3.7 mmol/L (ref 3.5–5.1)
Sodium: 137 mmol/L (ref 135–145)

## 2022-07-20 LAB — URINALYSIS, ROUTINE W REFLEX MICROSCOPIC
Bilirubin Urine: NEGATIVE
Glucose, UA: NEGATIVE mg/dL
Ketones, ur: NEGATIVE mg/dL
Nitrite: NEGATIVE
Specific Gravity, Urine: 1.027 (ref 1.005–1.030)
pH: 6.5 (ref 5.0–8.0)

## 2022-07-20 LAB — PREGNANCY, URINE: Preg Test, Ur: NEGATIVE

## 2022-07-20 LAB — TROPONIN I (HIGH SENSITIVITY): Troponin I (High Sensitivity): 3 ng/L (ref <18)

## 2022-07-20 MED ORDER — NITROFURANTOIN MONOHYD MACRO 100 MG PO CAPS: 100.0000 mg | ORAL_CAPSULE | Freq: Two times a day (BID) | ORAL | 0 refills | Status: DC

## 2022-07-20 MED ORDER — LOSARTAN POTASSIUM 25 MG PO TABS: 50.0000 mg | ORAL_TABLET | Freq: Every day | ORAL | 0 refills | Status: AC

## 2022-07-20 NOTE — ED Notes (Signed)
Call to lab to have culture added to urine that was previously sent

## 2022-07-20 NOTE — ED Triage Notes (Signed)
Pt is here for chest pain since October 8th.  Pt is tearful as she tells me "I lost my mom on the 8th so this is probably stress".  Pt has also had some frequent urination but reports that this is an intermittent issue for her.

## 2022-07-20 NOTE — ED Provider Notes (Signed)
[facility] DEPT Provider Note   CSN: [id number] Arrival date & time: 07/20/22  2671     History  Chief Complaint  Patient presents with   Chest Pain    Annette Jones is a 36 y.o. female.  36 year old female with a history of hypertension who presents emergency department with left-sided chest pressure.  States that she started having chest pressure when she was cleaning out her mother's place (who recently passed away) last 01/10/23.  Says that it is not exertional or pleuritic and that it goes away when she is doing other things and not thinking about it.  Says that she feels it when she is unoccupied and says that it is a discomfort but is not painful.  No radiation, diaphoresis, vomiting.  Does have mild shortness of breath associated with it but no new unilateral leg swelling or pain.  No recent surgeries.  No hormones or OCPs, history of DVT or PE, or history of cancer.   Past Medical History:  Diagnosis Date   Borderline diabetes    Cellulitis of lower leg 2012   "left"   Chlamydia 2009   CHLAMYDIA   Elevated hemoglobin A1c 04/14/2012   5.9 at NOB interview--plan early glucola at 18 weeks.    Infection    UTI X 1   Urinary tract infection    Yeast infection    frequent        Home Medications Prior to Admission medications   Medication Sig Start Date End Date Taking? Authorizing Provider  nitrofurantoin, macrocrystal-monohydrate, (MACROBID) 100 MG capsule Take 1 capsule (100 mg total) by mouth 2 (two) times daily. 07/20/22  Yes Fransico Meadow, MD  amoxicillin (AMOXIL) 500 MG capsule Take 1 capsule (500 mg total) by mouth 3 (three) times daily. 05/01/22   Jaynee Eagles, PA-C  HYDROcodone-acetaminophen (NORCO/VICODIN) 5-325 MG tablet Take 1 tablet by mouth every 6 (six) hours as needed (breakthrough pain). 11/12/21   Maczis, Barth Kirks, PA-C  ipratropium (ATROVENT) 0.03 % nasal spray Place 2 sprays into both nostrils 2 (two) times daily.  05/01/22   Jaynee Eagles, PA-C  levocetirizine (XYZAL) 5 MG tablet Take 1 tablet (5 mg total) by mouth every evening. 05/01/22   Jaynee Eagles, PA-C  levonorgestrel (MIRENA) 20 MCG/24HR IUD 1 each by Intrauterine route once.    [provider]  losartan (COZAAR) 25 MG tablet Take 2 tablets (50 mg total) by mouth daily. 07/20/22   Fransico Meadow, MD  methocarbamol (ROBAXIN) 500 MG tablet Take 1 tablet (500 mg total) by mouth every 6 (six) hours as needed for muscle spasms. 11/12/21   Maczis, Barth Kirks, PA-C  Misc Natural Products (AIRBORNE ELDERBERRY) CHEW Chew 1 tablet by mouth daily.    [provider]  pseudoephedrine (SUDAFED) 30 MG tablet Take 1 tablet (30 mg total) by mouth every 8 (eight) hours as needed for congestion. 05/01/22   Jaynee Eagles, PA-C      Allergies    Percocet [oxycodone-acetaminophen]    Review of Systems   Review of Systems  Physical Exam Updated Vital Signs BP (!) 149/92   Pulse (!) 56   Temp 98.5 F (36.9 C) (Oral)   Resp (!) 24   LMP 07/19/2022 (Exact Date)   SpO2 100%  Physical Exam Vitals and nursing note reviewed.  Constitutional:      General: She is not in acute distress.    Appearance: She is well-developed.  HENT:     Head: Normocephalic and atraumatic.  Right Ear: External ear normal.     Left Ear: External ear normal.     Nose: Nose normal.  Eyes:     Extraocular Movements: Extraocular movements intact.     Conjunctiva/sclera: Conjunctivae normal.     Pupils: Pupils are equal, round, and reactive to light.  Cardiovascular:     Rate and Rhythm: Normal rate and regular rhythm.     Heart sounds: No murmur heard. Pulmonary:     Effort: Pulmonary effort is normal. No respiratory distress.     Breath sounds: Normal breath sounds.  Musculoskeletal:        General: No swelling.     Cervical back: Normal range of motion and neck supple.     Right lower leg: No edema.     Left lower leg: No edema.  Skin:    General: Skin is warm  and dry.     Capillary Refill: Capillary refill takes less than 2 seconds.  Neurological:     Mental Status: She is alert and oriented to person, place, and time. Mental status is at baseline.  Psychiatric:        Mood and Affect: Mood normal.     ED Results / Procedures / Treatments   Labs (all labs ordered are listed, but only abnormal results are displayed) Labs Reviewed  BASIC METABOLIC PANEL - Abnormal; Notable for the following components:      Result Value   Glucose, Bld 130 (*)    Creatinine, Ser 1.12 (*)    All other components within normal limits  CBC - Abnormal; Notable for the following components:   Hemoglobin 11.6 (*)    HCT 35.5 (*)    All other components within normal limits  URINALYSIS, ROUTINE W REFLEX MICROSCOPIC - Abnormal; Notable for the following components:   Hgb urine dipstick LARGE (*)    Protein, ur TRACE (*)    Leukocytes,Ua SMALL (*)    Bacteria, UA RARE (*)    All other components within normal limits  URINE CULTURE  PREGNANCY, URINE  TROPONIN I (HIGH SENSITIVITY)  TROPONIN I (HIGH SENSITIVITY)    EKG EKG Interpretation  Date/Time:  Monday July 20 2022 08:17:49 EDT Ventricular Rate:  69 PR Interval:  147 QRS Duration: 110 QT Interval:  392 QTC Calculation: 420 R Axis:   17 Text Interpretation: Sinus rhythm Abnormal R-wave progression, early transition Borderline repolarization abnormality No significant change since last tracing Confirmed by Vonita MossPaterson, Aeva Posey [phone]([id number]) on 07/20/2022 8:25:12 AM  Radiology DG Chest 2 View  Result Date: 07/20/2022 CLINICAL DATA:  Intermittent chest pain since 07/05/2022, history asthma EXAM: CHEST - 2 VIEW COMPARISON:  10/26/2017 FINDINGS: Upper normal heart size. Mediastinal contours and pulmonary vascularity normal. Lungs clear. No pulmonary infiltrate, pleural effusion, or pneumothorax. No acute osseous findings. IMPRESSION: No acute abnormalities. Electronically Signed   By: Ulyses SouthwardMark  Boles M.D.   On:  07/20/2022 08:54    Procedures Procedures   Medications Ordered in ED Medications - No data to display  ED Course/ Medical Decision Making/ A&P                           Medical Decision Making Amount and/or Complexity of Data Reviewed Labs: ordered. Radiology: ordered.  Risk Prescription drug management.   Annette Jones is a 36 y.o. female with comorbidities that complicate the patient evaluation including hypertension who presents with chief complaint of chest discomfort and shortness of breath.  This patient presents  to the ED for concern of complaints listed in HPI, this involves an extensive number of treatment options, and is a complaint that carries with it a high risk of complications and morbidity. Disposition including potential need for admission considered.   Initial Ddx:  MI, PE, pneumonia, pneumothorax  MDM:  Feel the patient's pain is likely related to the loss of her mother.  Does not have clear features of any of the above diagnoses as it improves with exertion and is not pleuritic and does not have any other PE risk factors.  No infectious symptoms to suggest pneumonia.  No risk factors for pneumothorax.  Plan:  Labs Troponin EKG Chest x-ray Consider D-dimer but is PERC negative so not indicated  ED Summary/Re-evaluation:  Patient reevaluated and was stable.  Troponin and EKG reassuring.  Chest x-ray without acute findings.  Her urinalysis was concerning for possible UTI so given her frequency we will go ahead and treat her at this time though possible that this could be due to the fact that she is on her cycle.  Urine culture was also sent.  Informed the patient that she should follow-up with her primary doctor in several days.  Also refilled the patient's losartan for her.  Dispo: DC Home. Return precautions discussed including, but not limited to, those listed in the AVS. Allowed pt time to ask questions which were answered fully prior to  dc.   Records reviewed Care Everywhere The following labs were independently interpreted: Urinalysis and show urinary tract infection I independently reviewed the following imaging with scope of interpretation limited to determining acute life threatening conditions related to emergency care: Chest x-ray, which revealed no acute abnormality  I personally reviewed and interpreted cardiac monitoring: normal sinus rhythm  I personally reviewed and interpreted the pt's EKG: see above for interpretation  I have reviewed the patients home medications and made adjustments as needed  Final Clinical Impression(s) / ED Diagnoses Final diagnoses:  Chest pain, unspecified type  Acute cystitis with hematuria  Primary hypertension    Rx / DC Orders ED Discharge Orders          Ordered    losartan (COZAAR) 25 MG tablet  Daily        07/20/22 0954    nitrofurantoin, macrocrystal-monohydrate, (MACROBID) 100 MG capsule  2 times daily        07/20/22 0954              Fransico Meadow, MD 07/20/22 1001

## 2022-07-20 NOTE — Discharge Instructions (Addendum)
Today you were seen in the emergency department for your chest pain and urinary frequency.    In the emergency department you were found to have a urinary tract infection.    At home, please the antibiotic we have prescribed you.    Check your MyChart online for the results of any tests that had not resulted by the time you left the emergency department.   Follow-up with your primary doctor in 2-3 days regarding your visit.    Return immediately to the emergency department if you experience any of the following: worsening pain, difficulty breathing, fevers, flank pain, or any other concerning symptoms.    Thank you for visiting our Emergency Department. It was a pleasure taking care of you today.

## 2022-07-21 LAB — URINE CULTURE: Culture: NO GROWTH

## 2022-10-12 ENCOUNTER — Ambulatory Visit
Admission: EM | Admit: 2022-10-12 | Discharge: 2022-10-12 | Disposition: A | Discharge status: Home / Self Care | Payer: 59

## 2022-10-12 DIAGNOSIS — L818 Other specified disorders of pigmentation: Secondary | ICD-10-CM

## 2022-10-12 DIAGNOSIS — L03113 Cellulitis of right upper limb: Secondary | ICD-10-CM | POA: Diagnosis not present

## 2022-10-12 MED ORDER — HYDROXYZINE HCL 25 MG PO TABS: 12.5000 mg | ORAL_TABLET | Freq: Three times a day (TID) | ORAL | 0 refills | Status: AC | PRN

## 2022-10-12 MED ORDER — DOXYCYCLINE HYCLATE 100 MG PO CAPS: 100.0000 mg | ORAL_CAPSULE | Freq: Two times a day (BID) | ORAL | 0 refills | Status: AC

## 2022-10-12 NOTE — ED Triage Notes (Signed)
Pt reports she is having a reaction to a new tattoo x 2 days. Right lower arm is red. And warm

## 2022-10-12 NOTE — ED Provider Notes (Signed)
[facility]  Note:  This document was prepared using Systems analyst and may include unintentional dictation errors.  MRN: [id number] DOB: [birth year]  Subjective:   Annette Jones is a 37 y.o. female presenting for 2 day history of acute onset persistent redness, hot sensation and pain from a tattoo to the right forearm. No itching, hives. Has had cellulitis in the past and feels the same to her.   No current facility-administered medications for this encounter.  Current Outpatient Medications:    amoxicillin (AMOXIL) 500 MG capsule, Take 1 capsule (500 mg total) by mouth 3 (three) times daily., Disp: 15 capsule, Rfl: 0   HYDROcodone-acetaminophen (NORCO/VICODIN) 5-325 MG tablet, Take 1 tablet by mouth every 6 (six) hours as needed (breakthrough pain)., Disp: 15 tablet, Rfl: 0   ipratropium (ATROVENT) 0.03 % nasal spray, Place 2 sprays into both nostrils 2 (two) times daily., Disp: 30 mL, Rfl: 0   levocetirizine (XYZAL) 5 MG tablet, Take 1 tablet (5 mg total) by mouth every evening., Disp: 30 tablet, Rfl: 0   levonorgestrel (MIRENA) 20 MCG/24HR IUD, 1 each by Intrauterine route once., Disp: , Rfl:    losartan (COZAAR) 25 MG tablet, Take 2 tablets (50 mg total) by mouth daily., Disp: 30 tablet, Rfl: 0   methocarbamol (ROBAXIN) 500 MG tablet, Take 1 tablet (500 mg total) by mouth every 6 (six) hours as needed for muscle spasms., Disp: 30 tablet, Rfl: 0   Misc Natural Products (AIRBORNE ELDERBERRY) CHEW, Chew 1 tablet by mouth daily., Disp: , Rfl:    nitrofurantoin, macrocrystal-monohydrate, (MACROBID) 100 MG capsule, Take 1 capsule (100 mg total) by mouth 2 (two) times daily., Disp: 10 capsule, Rfl: 0   pseudoephedrine (SUDAFED) 30 MG tablet, Take 1 tablet (30 mg total) by mouth every 8 (eight) hours as needed for congestion., Disp: 30 tablet, Rfl: 0   Allergies  Allergen Reactions   Percocet [Oxycodone-Acetaminophen] Itching    Past Medical  History:  Diagnosis Date   Borderline diabetes    Cellulitis of lower leg 2012   "left"   Chlamydia 2009   CHLAMYDIA   Elevated hemoglobin A1c 04/14/2012   5.9 at NOB interview--plan early glucola at 18 weeks.    Infection    UTI X 1   Urinary tract infection    Yeast infection    frequent     Past Surgical History:  Procedure Laterality Date   CHOLECYSTECTOMY N/A 11/11/2021   Procedure: LAPAROSCOPIC CHOLECYSTECTOMY WITH ICG;  Surgeon: Greer Pickerel, MD;  Location: [facility] OR;  Service: General;  Laterality: N/A;   INDUCED ABORTION      Family History  Problem Relation Age of Onset   Hypertension Mother    Diabetes Maternal Grandmother    Hypertension Maternal Grandmother    Hypertension Cousin    Other Neg Hx     Social History   Tobacco Use   Smoking status: Never   Smokeless tobacco: Never  Substance Use Topics   Alcohol use: Yes    Alcohol/week: 9.0 standard drinks of alcohol    Types: 2 Shots of liquor, 7 Standard drinks or equivalent per week    Comment: 08/06/2015 "I'll drink some a couple times/month"   Drug use: No    ROS   Objective:   Vitals: BP (!) 153/113 (BP Location: Right Arm)   Pulse 97   Temp 98.4 F (36.9 C) (Oral)   Resp 20   LMP 10/01/2022 (Approximate)   SpO2 96%  Physical Exam Constitutional:      General: She is not in acute distress.    Appearance: Normal appearance. She is well-developed. She is not ill-appearing, toxic-appearing or diaphoretic.  HENT:     Head: Normocephalic and atraumatic.     Nose: Nose normal.     Mouth/Throat:     Mouth: Mucous membranes are moist.  Eyes:     General: No scleral icterus.       Right eye: No discharge.        Left eye: No discharge.     Extraocular Movements: Extraocular movements intact.  Cardiovascular:     Rate and Rhythm: Normal rate.  Pulmonary:     Effort: Pulmonary effort is normal.  Skin:    General: Skin is warm and dry.       Neurological:     General: No focal deficit  present.     Mental Status: She is alert and oriented to person, place, and time.  Psychiatric:        Mood and Affect: Mood normal.        Behavior: Behavior normal.         Assessment and Plan :   PDMP not reviewed this encounter.  1. Cellulitis of right upper extremity   2. Tattoo of skin     Start doxycycline for the cellulitis. Offered hydroxyzine as well just in case this is reactive. Counseled patient on potential for adverse effects with medications prescribed/recommended today, ER and return-to-clinic precautions discussed, patient verbalized understanding.    Jaynee Eagles, PA-C 10/12/22 1539

## 2022-10-20 ENCOUNTER — Encounter (HOSPITAL_BASED_OUTPATIENT_CLINIC_OR_DEPARTMENT_OTHER): Payer: Self-pay

## 2022-10-20 ENCOUNTER — Emergency Department (HOSPITAL_BASED_OUTPATIENT_CLINIC_OR_DEPARTMENT_OTHER)
Admission: EM | Admit: 2022-10-20 | Discharge: 2022-10-21 | Disposition: A | Discharge status: Home / Self Care | Payer: 59 | Attending: Emergency Medicine | Admitting: Emergency Medicine

## 2022-10-20 ENCOUNTER — Other Ambulatory Visit: Payer: Self-pay

## 2022-10-20 DIAGNOSIS — R202 Paresthesia of skin: Secondary | ICD-10-CM

## 2022-10-20 DIAGNOSIS — E876 Hypokalemia: Secondary | ICD-10-CM | POA: Diagnosis not present

## 2022-10-20 LAB — CBC WITH DIFFERENTIAL/PLATELET
Abs Immature Granulocytes: 0.01 10*3/uL (ref 0.00–0.07)
Basophils Absolute: 0 10*3/uL (ref 0.0–0.1)
Basophils Relative: 0 %
Eosinophils Absolute: 0 10*3/uL (ref 0.0–0.5)
Eosinophils Relative: 0 %
HCT: 36.9 % (ref 36.0–46.0)
Hemoglobin: 12.1 g/dL (ref 12.0–15.0)
Immature Granulocytes: 0 %
Lymphocytes Relative: 30 %
Lymphs Abs: 2.3 10*3/uL (ref 0.7–4.0)
MCH: 27.6 pg (ref 26.0–34.0)
MCHC: 32.8 g/dL (ref 30.0–36.0)
MCV: 84.2 fL (ref 80.0–100.0)
Monocytes Absolute: 0.7 10*3/uL (ref 0.1–1.0)
Monocytes Relative: 9 %
Neutro Abs: 4.6 10*3/uL (ref 1.7–7.7)
Neutrophils Relative %: 61 %
Platelets: 295 10*3/uL (ref 150–400)
RBC: 4.38 MIL/uL (ref 3.87–5.11)
RDW: 12.8 % (ref 11.5–15.5)
WBC: 7.6 10*3/uL (ref 4.0–10.5)
nRBC: 0 % (ref 0.0–0.2)

## 2022-10-20 LAB — CBG MONITORING, ED: Glucose-Capillary: 105 mg/dL — ABNORMAL HIGH (ref 70–99)

## 2022-10-20 NOTE — ED Triage Notes (Signed)
Pt arrives POV with c/o right side tingling for approximately a week and half.  Says she notices tingling to the right side of her face, right arm and right leg.  She reports tightness to right shin.  Pt ambulatory to triage, in NAD.

## 2022-10-20 NOTE — ED Provider Notes (Signed)
[place]  Provider Note  CSN: [id number] Arrival date & time: 10/20/22 1819  History Chief Complaint  Patient presents with   Tingling    Annette Jones is a 37 y.o. female with history of borderline DM on metformin reports she has had about 2 weeks of intermittent tingling on R face, arm and leg, seems to be worse at night when sleeping. No other particular provoking or relieving factors. Feels like it has fallen asleep, not loss of sensation. She ignored the symptoms thinking they would go away while she was out of town over the weekend but then she googled her symptoms and was concerned she had a stroke or needed to have her foot amputated. She also questions whether a new piecing in her R ear might have caused some nerve damage. She is currently asymptomatic.    Home Medications Prior to Admission medications   Medication Sig Start Date End Date Taking? Authorizing Provider  potassium chloride SA (KLOR-CON M) 20 MEQ tablet Take 1 tablet (20 mEq total) by mouth 2 (two) times daily for 3 days. 10/21/22 10/24/22 Yes Truddie Hidden, MD  doxycycline (VIBRAMYCIN) 100 MG capsule Take 1 capsule (100 mg total) by mouth 2 (two) times daily. 10/12/22   Jaynee Eagles, PA-C  hydrochlorothiazide (HYDRODIURIL) 25 MG tablet Take 25 mg by mouth every morning.    [provider]  hydrOXYzine (ATARAX) 25 MG tablet Take 0.5-1 tablets (12.5-25 mg total) by mouth every 8 (eight) hours as needed for itching. 10/12/22   Jaynee Eagles, PA-C  levonorgestrel (MIRENA) 20 MCG/24HR IUD 1 each by Intrauterine route once.    [provider]  losartan (COZAAR) 25 MG tablet Take 2 tablets (50 mg total) by mouth daily. 07/20/22   Fransico Meadow, MD  metFORMIN (GLUCOPHAGE-XR) 500 MG 24 hr tablet Take 500 mg by mouth at bedtime. 09/16/22   [provider]     Allergies    Percocet [oxycodone-acetaminophen]   Review of Systems   Review of  Systems Please see HPI for pertinent positives and negatives  Physical Exam BP (!) 138/108 (BP Location: Left Arm)   Pulse 71   Temp 98.4 F (36.9 C) (Oral)   Resp 16   Ht 5\' 5"  (1.651 m)   Wt 117.9 kg   LMP 10/01/2022 (Approximate)   SpO2 99%   BMI 43.27 kg/m   Physical Exam Vitals and nursing note reviewed.  Constitutional:      Appearance: Normal appearance.  HENT:     Head: Normocephalic and atraumatic.     Nose: Nose normal.     Mouth/Throat:     Mouth: Mucous membranes are moist.  Eyes:     Extraocular Movements: Extraocular movements intact.     Conjunctiva/sclera: Conjunctivae normal.  Cardiovascular:     Rate and Rhythm: Normal rate.  Pulmonary:     Effort: Pulmonary effort is normal.     Breath sounds: Normal breath sounds.  Abdominal:     General: Abdomen is flat.     Palpations: Abdomen is soft.     Tenderness: There is no abdominal tenderness.  Musculoskeletal:        General: No swelling. Normal range of motion.     Cervical back: Neck supple.  Skin:    General: Skin is warm and dry.  Neurological:     General: No focal deficit present.     Mental Status: She is alert and oriented to person, place, and  time.     Cranial Nerves: No cranial nerve deficit.     Sensory: No sensory deficit.     Motor: No weakness.     Coordination: Coordination normal.     Gait: Gait normal.  Psychiatric:        Mood and Affect: Mood normal.     ED Results / Procedures / Treatments   EKG EKG Interpretation  Date/Time:  Tuesday October 20 2022 18:47:29 EST Ventricular Rate:  84 PR Interval:  148 QRS Duration: 82 QT Interval:  376 QTC Calculation: 444 R Axis:   27 Text Interpretation: Normal sinus rhythm Cannot rule out Anterior infarct , age undetermined Abnormal ECG When compared with ECG of 20-Jul-2022 08:17, PREVIOUS ECG IS PRESENT Confirmed by Regan Lemming ([id number]) on 10/20/2022 7:01:31 PM  Procedures Procedures  Medications Ordered in the  ED Medications - No data to display  Initial Impression and Plan  Patient here with paresthesias off and on for 2 weeks, normal neuro exam now. No concern for stroke. Plan labs to eval anemia (? B12 deficiency) or elyte abnormality. Anticipate discharge with outpatient follow up.   ED Course   Clinical Course as of 10/21/22 0034  Tue Oct 20, 2022  2352 CBC is normal.  [CS]  Wed Oct 21, 2022  0033 BMP with mild hyponatremia and hypokalemia. Otherwise unremarkable. Will plan discharge with Rx for oral K repletion, recommend close outpatient follow up for recheck in a few days. RTED for any other concerns.  [CS]    Clinical Course User Index [CS] Truddie Hidden, MD     MDM Rules/Calculators/A&P Medical Decision Making Problems Addressed: Hypokalemia: acute illness or injury Paresthesia: acute illness or injury  Amount and/or Complexity of Data Reviewed Labs: ordered. Decision-making details documented in ED Course. ECG/medicine tests: ordered and independent interpretation performed. Decision-making details documented in ED Course.  Risk Prescription drug management.     Final Clinical Impression(s) / ED Diagnoses Final diagnoses:  Paresthesia  Hypokalemia    Rx / DC Orders ED Discharge Orders          Ordered    potassium chloride SA (KLOR-CON M) 20 MEQ tablet  2 times daily        10/21/22 0034             Truddie Hidden, MD 10/21/22 [phone]

## 2022-10-21 LAB — BASIC METABOLIC PANEL
Anion gap: 9 (ref 5–15)
BUN: 20 mg/dL (ref 6–20)
CO2: 25 mmol/L (ref 22–32)
Calcium: 9.7 mg/dL (ref 8.9–10.3)
Chloride: 99 mmol/L (ref 98–111)
Creatinine, Ser: 1.16 mg/dL — ABNORMAL HIGH (ref 0.44–1.00)
GFR, Estimated: >60 mL/min (ref >60)
Glucose, Bld: 107 mg/dL — ABNORMAL HIGH (ref 70–99)
Potassium: 3.3 mmol/L — ABNORMAL LOW (ref 3.5–5.1)
Sodium: 133 mmol/L — ABNORMAL LOW (ref 135–145)

## 2022-10-21 MED ORDER — POTASSIUM CHLORIDE CRYS ER 20 MEQ PO TBCR: 20.0000 meq | EXTENDED_RELEASE_TABLET | Freq: Two times a day (BID) | ORAL | 0 refills | Status: DC

## 2023-02-16 ENCOUNTER — Emergency Department (HOSPITAL_COMMUNITY)
Admission: EM | Admit: 2023-02-16 | Discharge: 2023-02-16 | Disposition: A | Discharge status: Home / Self Care | Payer: 59 | Attending: Emergency Medicine | Admitting: Emergency Medicine

## 2023-02-16 ENCOUNTER — Other Ambulatory Visit: Payer: Self-pay

## 2023-02-16 ENCOUNTER — Encounter (HOSPITAL_COMMUNITY): Payer: Self-pay

## 2023-02-16 DIAGNOSIS — E876 Hypokalemia: Secondary | ICD-10-CM | POA: Insufficient documentation

## 2023-02-16 DIAGNOSIS — Z1152 Encounter for screening for COVID-19: Secondary | ICD-10-CM | POA: Diagnosis not present

## 2023-02-16 DIAGNOSIS — M7918 Myalgia, other site: Secondary | ICD-10-CM | POA: Diagnosis present

## 2023-02-16 DIAGNOSIS — I10 Essential (primary) hypertension: Secondary | ICD-10-CM | POA: Diagnosis not present

## 2023-02-16 LAB — BASIC METABOLIC PANEL
Anion gap: 11 (ref 5–15)
BUN: 18 mg/dL (ref 6–20)
CO2: 27 mmol/L (ref 22–32)
Calcium: 9 mg/dL (ref 8.9–10.3)
Chloride: 98 mmol/L (ref 98–111)
Creatinine, Ser: 1.31 mg/dL — ABNORMAL HIGH (ref 0.44–1.00)
GFR, Estimated: 53 mL/min — ABNORMAL LOW (ref >60)
Glucose, Bld: 91 mg/dL (ref 70–99)
Potassium: 3 mmol/L — ABNORMAL LOW (ref 3.5–5.1)
Sodium: 136 mmol/L (ref 135–145)

## 2023-02-16 LAB — CBC WITH DIFFERENTIAL/PLATELET
Abs Immature Granulocytes: 0.01 10*3/uL (ref 0.00–0.07)
Basophils Absolute: 0 10*3/uL (ref 0.0–0.1)
Basophils Relative: 0 %
Eosinophils Absolute: 0 10*3/uL (ref 0.0–0.5)
Eosinophils Relative: 0 %
HCT: 37 % (ref 36.0–46.0)
Hemoglobin: 11.8 g/dL — ABNORMAL LOW (ref 12.0–15.0)
Immature Granulocytes: 0 %
Lymphocytes Relative: 32 %
Lymphs Abs: 2.7 10*3/uL (ref 0.7–4.0)
MCH: 27.1 pg (ref 26.0–34.0)
MCHC: 31.9 g/dL (ref 30.0–36.0)
MCV: 84.9 fL (ref 80.0–100.0)
Monocytes Absolute: 0.9 10*3/uL (ref 0.1–1.0)
Monocytes Relative: 11 %
Neutro Abs: 4.7 10*3/uL (ref 1.7–7.7)
Neutrophils Relative %: 57 %
Platelets: 370 10*3/uL (ref 150–400)
RBC: 4.36 MIL/uL (ref 3.87–5.11)
RDW: 13 % (ref 11.5–15.5)
WBC: 8.3 10*3/uL (ref 4.0–10.5)
nRBC: 0 % (ref 0.0–0.2)

## 2023-02-16 LAB — SARS CORONAVIRUS 2 BY RT PCR: SARS Coronavirus 2 by RT PCR: NEGATIVE

## 2023-02-16 MED ORDER — POTASSIUM CHLORIDE CRYS ER 20 MEQ PO TBCR
20.0000 meq | EXTENDED_RELEASE_TABLET | Freq: Two times a day (BID) | ORAL | 0 refills | Status: AC
Start: 2023-02-16 — End: 2023-02-19

## 2023-02-16 MED ORDER — POTASSIUM CHLORIDE CRYS ER 20 MEQ PO TBCR
40.0000 meq | EXTENDED_RELEASE_TABLET | Freq: Once | ORAL | Status: AC
Start: 2023-02-16 — End: 2023-02-16
  Administered 2023-02-16: 40 meq via ORAL
  Filled 2023-02-16: qty 2

## 2023-02-16 MED ORDER — CYCLOBENZAPRINE HCL 10 MG PO TABS: 10.0000 mg | ORAL_TABLET | Freq: Two times a day (BID) | ORAL | 0 refills | Status: AC | PRN

## 2023-02-16 NOTE — Discharge Instructions (Addendum)
Please pick up the potassium pills and muscle relaxer I prescribed for you.  Please do not operate machinery or drive after using the muscle relaxers as they are very sedating medication and only use them at night before bed.  Today your potassium was low which may explain your cramping.  Please eat a banana daily or other potassium rich foods.  Please follow-up with your primary care provider regarding recent symptoms and ER visit.  If symptoms worsen please return to ER.

## 2023-02-16 NOTE — ED Triage Notes (Signed)
Pt states her body is crampingx4d.

## 2023-02-16 NOTE — ED Notes (Signed)
Pt given crackers and drink 

## 2023-02-16 NOTE — ED Provider Notes (Cosign Needed Addendum)
[place] [facility] AT [facility] Provider Note   CSN: [id number] Arrival date & time: 02/16/23  1645     History  Chief Complaint  Patient presents with   bodyaches    Annette Jones is a 37 y.o. female presented for body cramping that is been present for the last 4 days.  Patient states that this cramping is more prevalent at night when she is sleeping.  Patient states that she is prediabetic with hypertension currently taking hydrochlorothiazide.  Patient states that she has full sensation in all 4 limbs and has not experienced any new onset weakness.  Patient denies any recent trauma, headaches, vision changes, neck pain.  Patient dates the cramping has progressed from both of her legs to her back over the past 4 days.  Patient denies history of neurologic diseases.  Patient denied chest pain, shortness of breath, dysuria, back pain, recent illness, fevers, nausea/vomiting, urinary or bowel changes  Home Medications Prior to Admission medications   Medication Sig Start Date End Date Taking? Authorizing Provider  doxycycline (VIBRAMYCIN) 100 MG capsule Take 1 capsule (100 mg total) by mouth 2 (two) times daily. 10/12/22   Wallis Bamberg, PA-C  hydrochlorothiazide (HYDRODIURIL) 25 MG tablet Take 25 mg by mouth every morning.    [provider]  hydrOXYzine (ATARAX) 25 MG tablet Take 0.5-1 tablets (12.5-25 mg total) by mouth every 8 (eight) hours as needed for itching. 10/12/22   Wallis Bamberg, PA-C  levonorgestrel (MIRENA) 20 MCG/24HR IUD 1 each by Intrauterine route once.    [provider]  losartan (COZAAR) 25 MG tablet Take 2 tablets (50 mg total) by mouth daily. 07/20/22   Rondel Baton, MD  metFORMIN (GLUCOPHAGE-XR) 500 MG 24 hr tablet Take 500 mg by mouth at bedtime. 09/16/22   [provider]  potassium chloride SA (KLOR-CON M) 20 MEQ tablet Take 1 tablet (20 mEq total) by mouth 2 (two) times daily for 3 days. 02/16/23 02/19/23   Netta Corrigan, PA-C      Allergies    Percocet [oxycodone-acetaminophen]    Review of Systems   Review of Systems See HPI Physical Exam Updated Vital Signs BP (!) 136/97   Pulse 89   Temp 98.1 F (36.7 C) (Oral)   Resp 18   Ht 5\' 5"  (1.651 m)   Wt 117.9 kg   SpO2 100%   BMI 43.25 kg/m  Physical Exam Vitals reviewed.  Constitutional:      General: She is not in acute distress. HENT:     Head: Normocephalic and atraumatic.  Eyes:     Extraocular Movements: Extraocular movements intact.     Conjunctiva/sclera: Conjunctivae normal.     Pupils: Pupils are equal, round, and reactive to light.  Cardiovascular:     Rate and Rhythm: Normal rate and regular rhythm.     Pulses: Normal pulses.     Heart sounds: Normal heart sounds.     Comments: 2+ bilateral radial/dorsalis pedis pulses with regular rate Pulmonary:     Effort: Pulmonary effort is normal. No respiratory distress.     Breath sounds: Normal breath sounds.  Abdominal:     Palpations: Abdomen is soft.     Tenderness: There is no abdominal tenderness. There is no guarding or rebound.  Musculoskeletal:        General: Normal range of motion.     Cervical back: Normal range of motion and neck supple.     Comments: 5 out of 5  bilateral grip/leg extension strength  Skin:    General: Skin is warm and dry.     Capillary Refill: Capillary refill takes less than 2 seconds.     Comments: No overlying skin color changes No wounds noted on skin including plantar side of feet  Neurological:     General: No focal deficit present.     Mental Status: She is alert and oriented to person, place, and time.     Sensory: Sensation is intact.     Motor: Motor function is intact.     Coordination: Coordination is intact.     Gait: Gait is intact.     Comments: Sensation intact in all 4 limbs Cranial nerves III through XII intact Vision grossly intact  Psychiatric:        Mood and Affect: Mood normal.     ED Results /  Procedures / Treatments   Labs (all labs ordered are listed, but only abnormal results are displayed) Labs Reviewed  BASIC METABOLIC PANEL - Abnormal; Notable for the following components:      Result Value   Potassium 3.0 (*)    Creatinine, Ser 1.31 (*)    GFR, Estimated 53 (*)    All other components within normal limits  CBC WITH DIFFERENTIAL/PLATELET - Abnormal; Notable for the following components:   Hemoglobin 11.8 (*)    All other components within normal limits  SARS CORONAVIRUS 2 BY RT PCR    EKG None  Radiology No results found.  Procedures Procedures    Medications Ordered in ED Medications  potassium chloride SA (KLOR-CON M) CR tablet 40 mEq (40 mEq Oral Given 02/16/23 1919)    ED Course/ Medical Decision Making/ A&P                             Medical Decision Making Amount and/or Complexity of Data Reviewed Labs: ordered.  Risk Prescription drug management.   Marinus Maw 37 y.o. presented today for cramping. Working DDx that I considered at this time includes, but not limited to, electrolyte abnormalities, trauma, drug induced, spinal cord lesion, MS, diabetic neuropathy, diabetic foot ulcer.  R/o DDx: trauma, drug induced, spinal cord lesion, MS, diabetic neuropathy, diabetic foot ulcer: These are considered less likely due to history of present illness and physical exam findings  Review of prior external notes: 10/20/22 ED  Unique Tests and My Interpretation:  COVID: Negative BMP: Hypokalemia 3.0 CBC: Unremarkable  Discussion with Independent Historian: None  Discussion of Management of Tests: None  Risk: Medium: prescription drug management  Risk Stratification Score: none  Plan: Patient presented for cramping. On exam patient was in no acute distress and stable vitals.  Patient had unremarkable physical exam including full neuroexam.  Patient's history is also benign as well.  Patient states that she does have primary care follow-up  in the next few days.  Patient's BMP did show hypokalemia of 3.0 which could be causing patient's cramping symptoms and so patient was given potassium here in the ED and a short course of potassium over-the-counter.  I encouraged patient to eat potassium rich foods for the next few days and to monitor symptoms and if they worsen to return to ER.  Due to patient's reassuring physical exam, history, labs low suspicion of any life-threatening diagnoses at this time and we will forego advanced imaging at this time.  Patient was given return precautions. Patient stable for discharge at this time.  Patient  verbalized understanding of plan.         Final Clinical Impression(s) / ED Diagnoses Final diagnoses:  Hypokalemia    Rx / DC Orders ED Discharge Orders          Ordered    potassium chloride SA (KLOR-CON M) 20 MEQ tablet  2 times daily        02/16/23 1933                Remi Deter 02/16/23 2024    Glyn Ade, MD 02/17/23 [phone]

## 2023-04-06 ENCOUNTER — Ambulatory Visit
Admission: RE | Admit: 2023-04-06 | Discharge: 2023-04-06 | Disposition: A | Discharge status: Home / Self Care | Payer: 59 | Source: Ambulatory Visit | Attending: Internal Medicine | Admitting: Internal Medicine

## 2023-04-06 ENCOUNTER — Other Ambulatory Visit: Payer: Self-pay | Admitting: Internal Medicine

## 2023-04-06 DIAGNOSIS — R200 Anesthesia of skin: Secondary | ICD-10-CM

## 2023-04-19 IMAGING — US US ABDOMEN LIMITED
1 series · 14 of 25 positions shown · non-contrast
Comparison: CT 10/06/2015

CLINICAL DATA: Right upper quadrant pain, nausea

EXAM:
ULTRASOUND ABDOMEN LIMITED RIGHT UPPER QUADRANT

[Series 1: us abdomen limited ruq (liver/gb) · 14 of 43 slices shown]
[im 1/43]
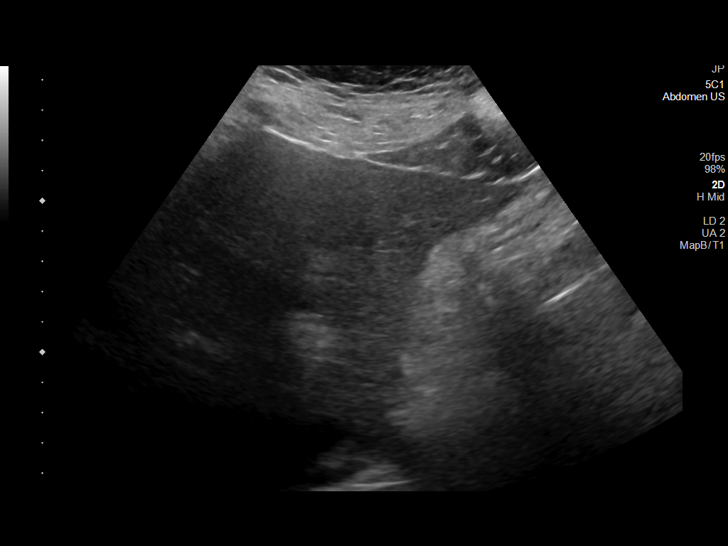
[im 4/43]
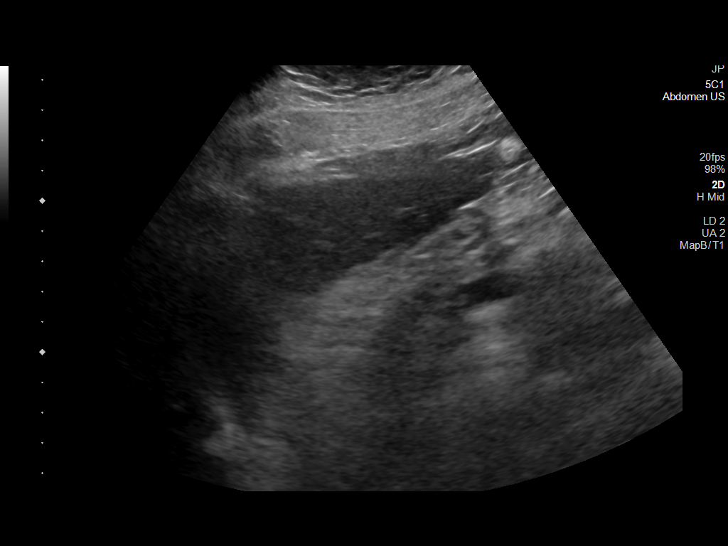
[im 8/43]
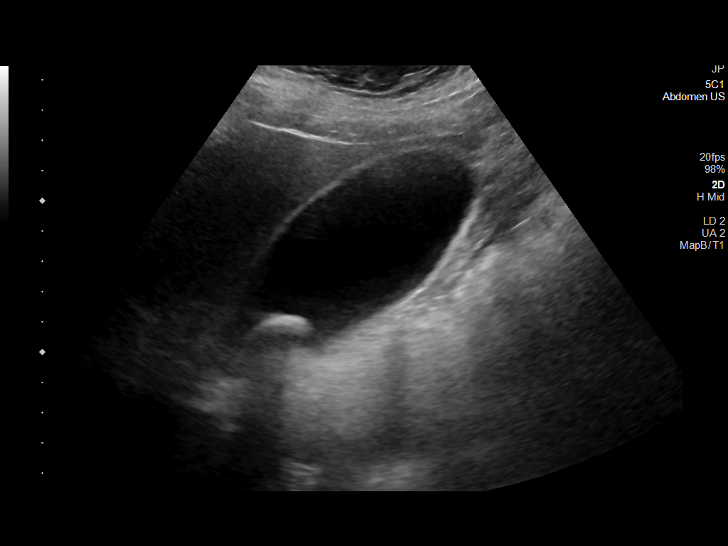
[im 11/43]
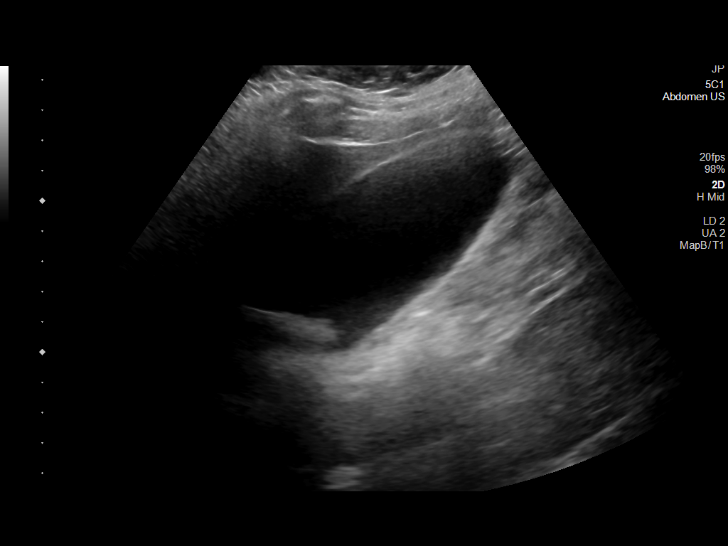
[im 15/43]
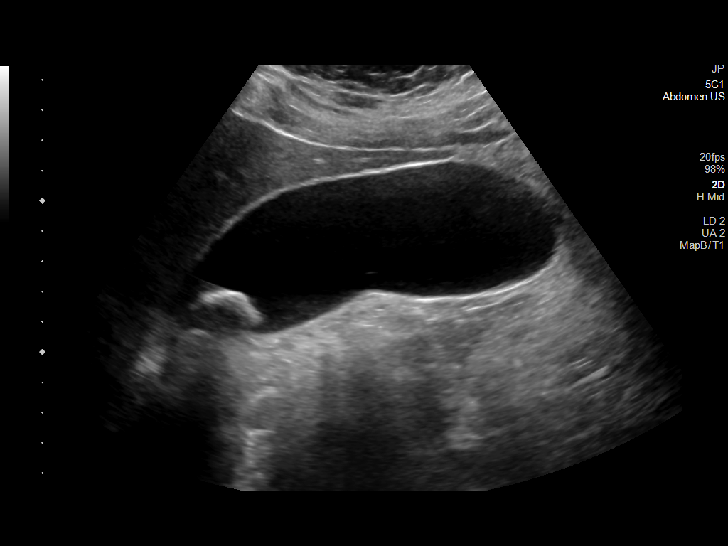
[im 16/43]
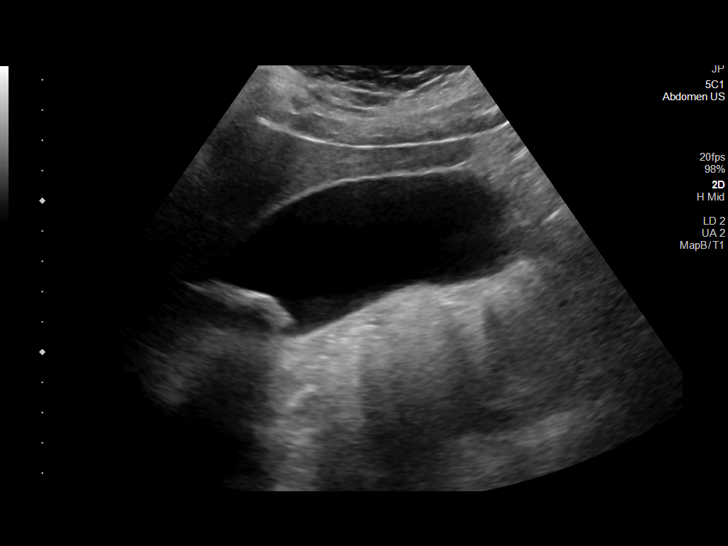
[im 20/43]
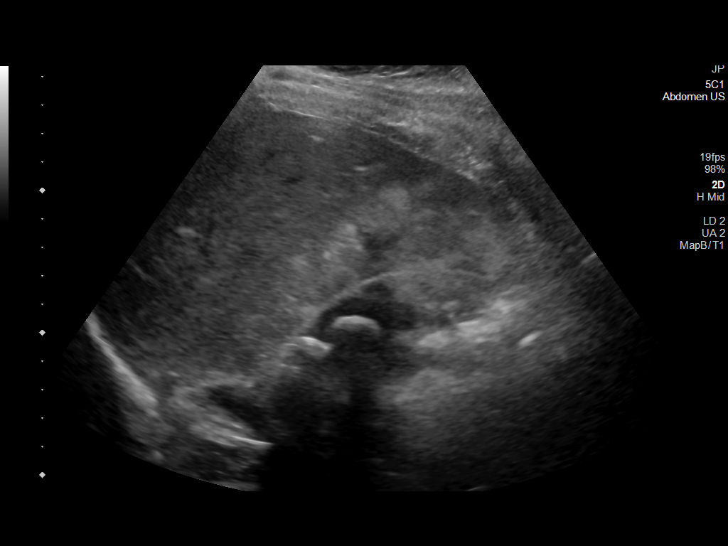
[im 23/43]
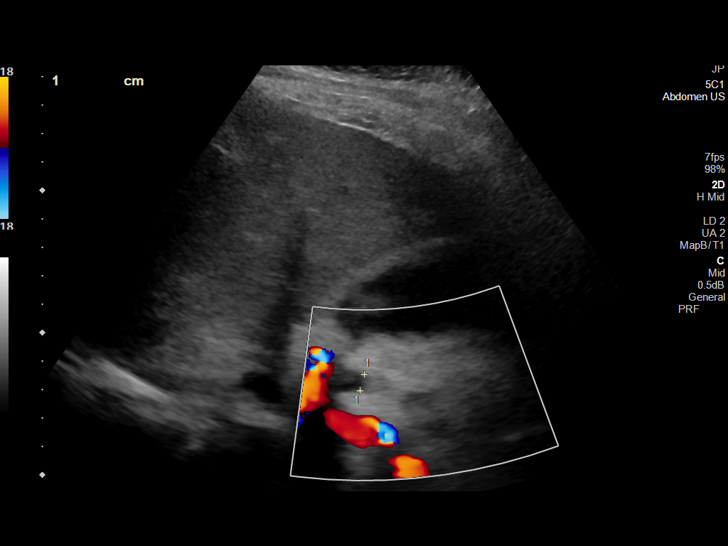
[im 27/43]
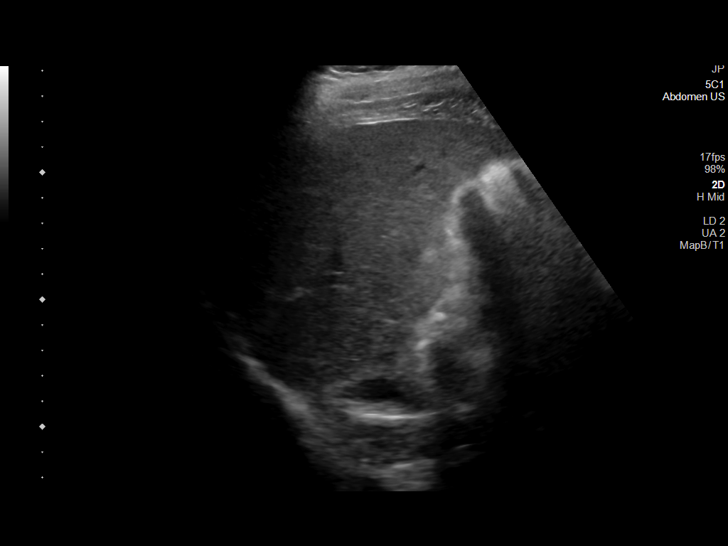
[im 29/43]
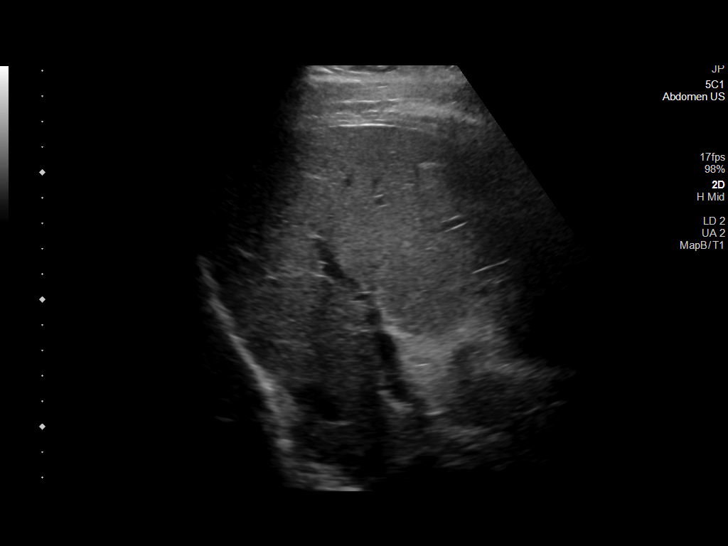
[im 32/43]
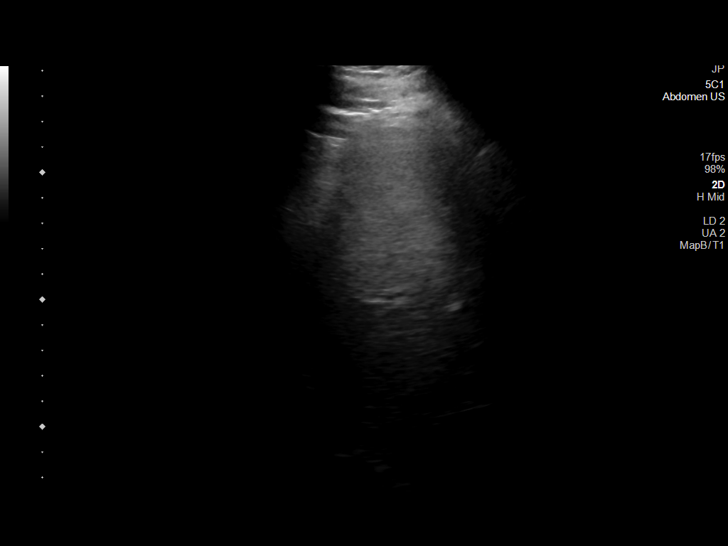
[im 36/43]
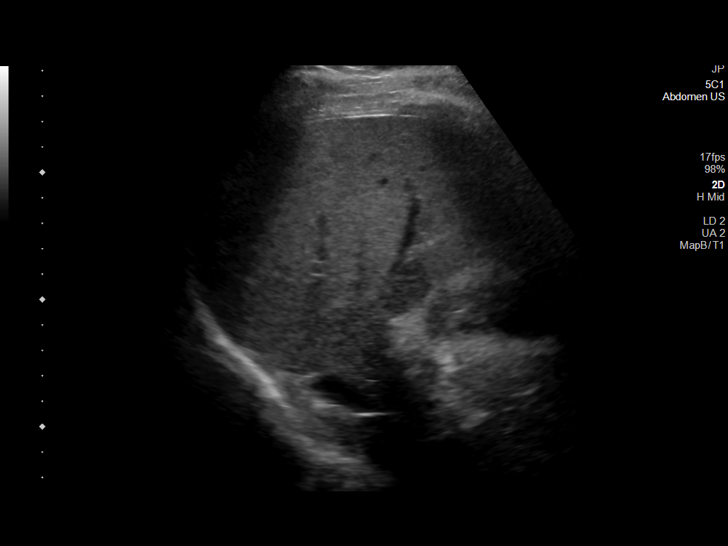
[im 39/43]
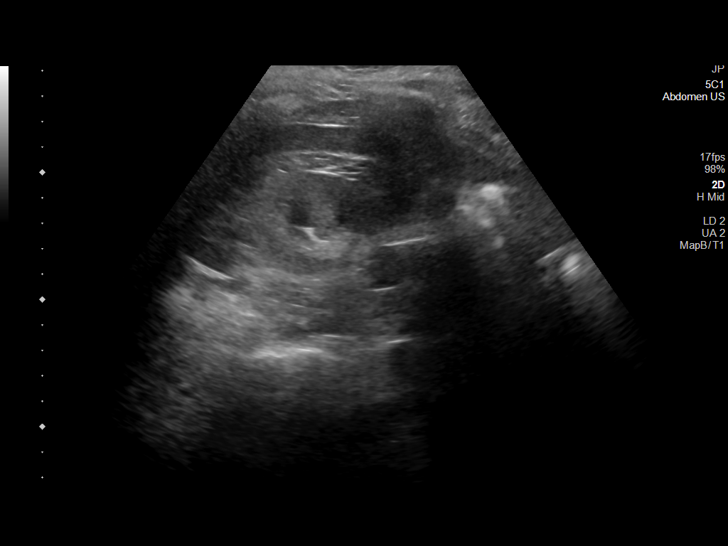
[im 43/43]
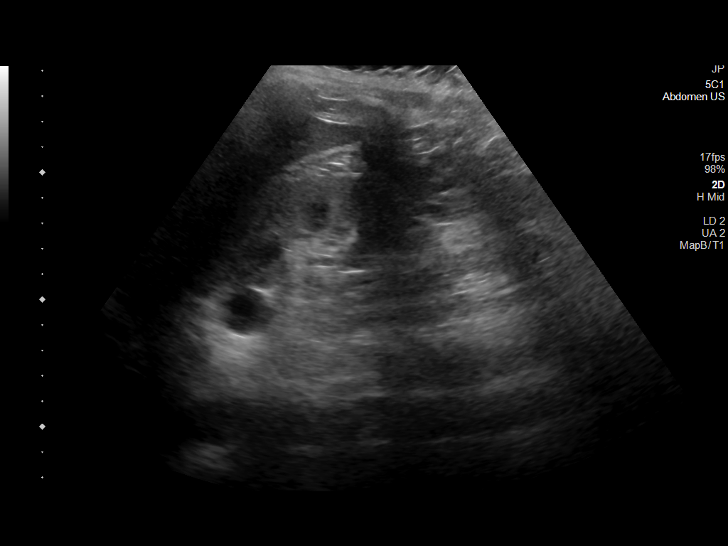

[14 of 25 positions shown; findings below may reference images not displayed]

FINDINGS: Gallbladder:

Moderately distended gallbladder containing 2 adjacent stones at the
gallbladder neck measuring up to 2.1 cm in diameter. No wall
thickening visualized. Sonographer did not indicate the presence or
absence of a Murphy sign.

Common bile duct:

Diameter: 5.9 mm.

Liver:

No focal lesion identified. Within normal limits in parenchymal
echogenicity. Portal vein is patent on color Doppler imaging with
normal direction of blood flow towards the liver.

Other: None.
IMPRESSION: Moderately distended gallbladder containing 2 large stones at the
gallbladder neck. No gallbladder wall thickening is evident.
Findings are equivocal for acute cholecystitis. If further imaging
evaluation is warranted clinically, a nuclear medicine hepatobiliary
scan may be helpful to assess the patency of the cystic duct and
common bile duct.

## 2023-10-12 ENCOUNTER — Other Ambulatory Visit: Payer: Self-pay | Admitting: Obstetrics and Gynecology

## 2023-10-12 DIAGNOSIS — Z1231 Encounter for screening mammogram for malignant neoplasm of breast: Secondary | ICD-10-CM

## 2023-11-10 ENCOUNTER — Ambulatory Visit
Admission: RE | Admit: 2023-11-10 | Discharge: 2023-11-10 | Disposition: A | Discharge status: Home / Self Care | Payer: 59 | Source: Ambulatory Visit | Attending: Obstetrics and Gynecology | Admitting: Obstetrics and Gynecology

## 2023-11-10 ENCOUNTER — Ambulatory Visit: Payer: 59

## 2023-11-10 DIAGNOSIS — Z1231 Encounter for screening mammogram for malignant neoplasm of breast: Secondary | ICD-10-CM

## 2023-11-30 ENCOUNTER — Ambulatory Visit
Admission: EM | Admit: 2023-11-30 | Discharge: 2023-11-30 | Disposition: A | Discharge status: Home / Self Care | Attending: Family Medicine | Admitting: Family Medicine

## 2023-11-30 DIAGNOSIS — J101 Influenza due to other identified influenza virus with other respiratory manifestations: Secondary | ICD-10-CM

## 2023-11-30 LAB — POC COVID19/FLU A&B COMBO
Covid Antigen, POC: NEGATIVE
Influenza A Antigen, POC: POSITIVE — AB
Influenza B Antigen, POC: NEGATIVE

## 2023-11-30 MED ORDER — BENZONATATE 200 MG PO CAPS
200.0000 mg | ORAL_CAPSULE | Freq: Three times a day (TID) | ORAL | 0 refills | Status: AC | PRN
Start: 2023-11-30 — End: ?

## 2023-11-30 MED ORDER — OSELTAMIVIR PHOSPHATE 75 MG PO CAPS
75.0000 mg | ORAL_CAPSULE | Freq: Two times a day (BID) | ORAL | 0 refills | Status: AC
Start: 2023-11-30 — End: ?

## 2023-11-30 NOTE — ED Triage Notes (Signed)
 Pt presents with c/o coughing and body aches x 3 days. Pt states she has taken Tylenol, Mucinex. States her daughter was sick recently.

## 2023-11-30 NOTE — ED Provider Notes (Signed)
 [facility]    CSN: [id number] Arrival date & time: 11/30/23  1523      History   Chief Complaint Chief Complaint  Patient presents with   URI    HPI Annette Jones is a 38 y.o. female  presents for evaluation of URI symptoms for 3 days. Patient reports associated symptoms of cough, congestion, body aches, chills. Denies N/V/D, fevers, ear pain, shortness of breath. Patient does not have a hx of asthma. Patient is not an active smoker.   Reports daughter had similar symptoms last week..  Pt has taken Mucinex and Tylenol OTC for symptoms. Pt has no other concerns at this time.    URI Presenting symptoms: congestion and cough   Associated symptoms: myalgias     Past Medical History:  Diagnosis Date   Borderline diabetes    Cellulitis of lower leg 2012   "left"   Chlamydia 2009   CHLAMYDIA   Elevated hemoglobin A1c 04/14/2012   5.9 at NOB interview--plan early glucola at 18 weeks.    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 Authorizing Provider  benzonatate (TESSALON) 200 MG capsule Take 1 capsule (200 mg total) by mouth 3 (three) times daily as needed. 11/30/23  Yes Radford Pax, NP  oseltamivir (TAMIFLU) 75 MG capsule Take 1 capsule (75 mg total) by mouth every 12 (twelve) hours. 11/30/23  Yes Radford Pax, NP  cyclobenzaprine (FLEXERIL) 10 MG tablet Take 1 tablet (10 mg total) by mouth 2 (two) times daily as needed for muscle spasms. 02/16/23   Netta Corrigan, PA-C  doxycycline (VIBRAMYCIN) 100 MG capsule Take 1 capsule (100 mg total) by mouth 2 (two) times daily. 10/12/22   Wallis Bamberg, PA-C  hydrochlorothiazide (HYDRODIURIL) 25 MG tablet Take 25 mg by mouth every morning.    [provider]  hydrOXYzine (ATARAX) 25 MG tablet Take 0.5-1 tablets (12.5-25 mg total) by mouth every 8 (eight) hours as needed for itching. 10/12/22   Wallis Bamberg, PA-C  levonorgestrel (MIRENA) 20 MCG/24HR IUD 1 each by Intrauterine route once.    [provider]  losartan (COZAAR) 25 MG tablet Take 2 tablets (50 mg total) by mouth daily. 07/20/22   Rondel Baton, MD  metFORMIN (GLUCOPHAGE-XR) 500 MG 24 hr tablet Take 500 mg by mouth at bedtime. 09/16/22   [provider]  potassium chloride SA (KLOR-CON M) 20 MEQ tablet Take 1 tablet (20 mEq total) by mouth 2 (two) times daily for 3 days. 02/16/23 02/19/23  Netta Corrigan, PA-C    Family History Family History  Problem Relation Age of Onset   Hypertension Mother    Diabetes Maternal Grandmother    Hypertension Maternal Grandmother    Hypertension Cousin    Other Neg Hx     Social History Social History   Tobacco Use   Smoking status: Never   Smokeless tobacco: Never   Tobacco comments:    Says she smokes a hooka occasionallly  Substance Use Topics   Alcohol use: Not Currently    Comment: occ   Drug use: No      Allergies   Percocet [oxycodone-acetaminophen]   Review of Systems Review of Systems  Constitutional:  Positive for chills.  HENT:  Positive for congestion.   Respiratory:  Positive for cough.   Musculoskeletal:  Positive for myalgias.     Physical Exam Triage Vital Signs ED Triage Vitals  Encounter Vitals Group     BP 11/30/23 1714 (!) 122/91     Systolic BP Percentile --      Diastolic BP Percentile --      Pulse Rate 11/30/23 1713 (!) 105     Resp 11/30/23 1713 18     Temp 11/30/23 1713 99.4 F (37.4 C)     Temp Source 11/30/23 1713 Oral     SpO2 11/30/23 1713 94 %     Weight --      Height --      Head Circumference --      Peak Flow --      Pain Score 11/30/23 1712 5     Pain Loc --      Pain Education --      Exclude from Growth Chart --    No data found.  Updated Vital Signs BP (!) 122/91   Pulse (!) 105   Temp 99.4 F (37.4 C) (Oral)   Resp 18   LMP  (LMP Unknown)   SpO2 94%   Visual Acuity Right Eye Distance:   Left Eye Distance:   Bilateral Distance:    Right Eye Near:   Left Eye Near:    Bilateral Near:     Physical Exam Vitals and nursing note reviewed.  Constitutional:      General: She is not in acute distress.    Appearance: She is well-developed. She is not ill-appearing.  HENT:     Head: Normocephalic and atraumatic.     Right Ear: Tympanic membrane and ear canal normal.     Left Ear: Tympanic membrane and ear canal normal.     Nose: Congestion present.     Mouth/Throat:     Mouth: Mucous membranes are moist.     Pharynx: Oropharynx is clear. Uvula midline. No posterior oropharyngeal erythema.     Tonsils: No tonsillar exudate or tonsillar abscesses.  Eyes:     Conjunctiva/sclera: Conjunctivae normal.     Pupils: Pupils are equal, round, and reactive to light.  Cardiovascular:     Rate and Rhythm: Regular rhythm. Tachycardia present.     Heart sounds: Normal heart sounds.  Pulmonary:  Effort: Pulmonary effort is  normal.     Breath sounds: Normal breath sounds. No wheezing or rhonchi.  Musculoskeletal:     Cervical back: Normal range of motion and neck supple.  Lymphadenopathy:     Cervical: No cervical adenopathy.  Skin:    General: Skin is warm and dry.  Neurological:     General: No focal deficit present.     Mental Status: She is alert and oriented to person, place, and time.  Psychiatric:        Mood and Affect: Mood normal.        Behavior: Behavior normal.      UC Treatments / Results  Labs (all labs ordered are listed, but only abnormal results are displayed) Labs Reviewed  POC COVID19/FLU A&B COMBO - Abnormal; Notable for the following components:      Result Value   Influenza A Antigen, POC Positive (*)    All other components within normal limits    EKG   Radiology No results found.  Procedures Procedures (including critical care time)  Medications Ordered in UC Medications - No data to display  Initial Impression / Assessment and Plan / UC Course  I have reviewed the triage vital signs and the nursing notes.  Pertinent labs & imaging results that were available during my care of the patient were reviewed by me and considered in my medical decision making (see chart for details).     Reviewed exam and symptoms with patient.  No red flags.  Positive influenza A.  Start Tamiflu and Tessalon.  Discussed viral illness and symptomatic treatment.  PCP follow-up if symptoms do not improve.  ER precautions reviewed and patient verbalized understanding. Final Clinical Impressions(s) / UC Diagnoses   Final diagnoses:  Influenza A     Discharge Instructions      You may start Tamiflu twice daily for 5 days.  Start Tessalon as needed for your cough.  Lots of rest and fluids.  Over-the-counter Tylenol or ibuprofen as needed for fever management.  Please follow-up with your PCP if your symptoms do not improve.  Please go to the ER for any worsening symptoms.  Hope you  feel better soon!     ED Prescriptions     Medication Sig Dispense Auth. Provider   benzonatate (TESSALON) 200 MG capsule Take 1 capsule (200 mg total) by mouth 3 (three) times daily as needed. 20 capsule Radford Pax, NP   oseltamivir (TAMIFLU) 75 MG capsule Take 1 capsule (75 mg total) by mouth every 12 (twelve) hours. 10 capsule Radford Pax, NP      PDMP not reviewed this encounter.   Radford Pax, NP 11/30/23 1816

## 2023-11-30 NOTE — Discharge Instructions (Signed)
 You may start Tamiflu twice daily for 5 days.  Start Tessalon as needed for your cough.  Lots of rest and fluids.  Over-the-counter Tylenol or ibuprofen as needed for fever management.  Please follow-up with your PCP if your symptoms do not improve.  Please go to the ER for any worsening symptoms.  Hope you feel better soon!

## 2024-06-06 ENCOUNTER — Ambulatory Visit
Admission: EM | Admit: 2024-06-06 | Discharge: 2024-06-06 | Disposition: A | Discharge status: Home / Self Care | Attending: Family Medicine | Admitting: Family Medicine

## 2024-06-06 DIAGNOSIS — Z1152 Encounter for screening for COVID-19: Secondary | ICD-10-CM | POA: Diagnosis not present

## 2024-06-06 DIAGNOSIS — U071 COVID-19: Secondary | ICD-10-CM

## 2024-06-06 LAB — POC SOFIA SARS ANTIGEN FIA: SARS Coronavirus 2 Ag: POSITIVE — AB

## 2024-06-06 MED ORDER — IPRATROPIUM BROMIDE 0.03 % NA SOLN: 2.0000 | Freq: Two times a day (BID) | NASAL | 0 refills | Status: AC

## 2024-06-06 MED ORDER — CETIRIZINE HCL 10 MG PO TABS: 10.0000 mg | ORAL_TABLET | Freq: Every day | ORAL | 0 refills | Status: AC

## 2024-06-06 MED ORDER — PROMETHAZINE-DM 6.25-15 MG/5ML PO SYRP: 5.0000 mL | ORAL_SOLUTION | Freq: Three times a day (TID) | ORAL | 0 refills | Status: AC | PRN

## 2024-06-06 MED ORDER — IBUPROFEN 800 MG PO TABS: 800.0000 mg | ORAL_TABLET | Freq: Three times a day (TID) | ORAL | 0 refills | Status: AC

## 2024-06-06 NOTE — ED Triage Notes (Signed)
 Pt reports cough and nasal congestion x 4 days. OTC meds gives no relief.

## 2024-06-06 NOTE — ED Provider Notes (Signed)
 [facility]  Note:  This document was prepared using Conservation officer, historic buildings and may include unintentional dictation errors.  MRN: [id number] DOB: 1985-12-04  Subjective:   Annette Jones is a 38 y.o. female presenting for 4 day history of sinus congestion, coughing, throat pain, mild body aches. No fever, chest pain, shob, wheezing. No asthma. No immunocompromising conditions. Does hookah occasionally.   No current facility-administered medications for this encounter.  Current Outpatient Medications:    benzonatate  (TESSALON ) 200 MG capsule, Take 1 capsule (200 mg total) by mouth 3 (three) times daily as needed., Disp: 20 capsule, Rfl: 0   cyclobenzaprine  (FLEXERIL ) 10 MG tablet, Take 1 tablet (10 mg total) by mouth 2 (two) times daily as needed for muscle spasms., Disp: 20 tablet, Rfl: 0   doxycycline  (VIBRAMYCIN ) 100 MG capsule, Take 1 capsule (100 mg total) by mouth 2 (two) times daily., Disp: 20 capsule, Rfl: 0   hydrochlorothiazide (HYDRODIURIL) 25 MG tablet, Take 25 mg by mouth every morning., Disp: , Rfl:    hydrOXYzine  (ATARAX ) 25 MG tablet, Take 0.5-1 tablets (12.5-25 mg total) by mouth every 8 (eight) hours as needed for itching., Disp: 30 tablet, Rfl: 0   levonorgestrel (MIRENA) 20 MCG/24HR IUD, 1 each by Intrauterine route once., Disp: , Rfl:    losartan  (COZAAR ) 25 MG tablet, Take 2 tablets (50 mg total) by mouth daily., Disp: 30 tablet, Rfl: 0   metFORMIN (GLUCOPHAGE-XR) 500 MG 24 hr tablet, Take 500 mg by mouth at bedtime., Disp: , Rfl:    oseltamivir  (TAMIFLU ) 75 MG capsule, Take 1 capsule (75 mg total) by mouth every 12 (twelve) hours., Disp: 10 capsule, Rfl: 0   potassium chloride  SA (KLOR-CON  M) 20 MEQ tablet, Take 1 tablet (20 mEq total) by mouth 2 (two) times daily for 3 days., Disp: 6 tablet, Rfl: 0   Allergies  Allergen Reactions   Percocet [Oxycodone -Acetaminophen ] Itching    Past Medical History:  Diagnosis Date   Borderline  diabetes    Cellulitis of lower leg 2012   left   Chlamydia 2009   CHLAMYDIA   Elevated hemoglobin A1c 04/14/2012   5.9 at NOB interview--plan early glucola at 18 weeks.    Infection    UTI X 1   Urinary tract infection    Yeast infection    frequent     Past Surgical History:  Procedure Laterality Date   CHOLECYSTECTOMY N/A 11/11/2021   Procedure: LAPAROSCOPIC CHOLECYSTECTOMY WITH ICG;  Surgeon: Tanda Locus, MD;  Location: [facility] OR;  Service: General;  Laterality: N/A;   INDUCED ABORTION      Family History  Problem Relation Age of Onset   Hypertension Mother    Diabetes Maternal Grandmother    Hypertension Maternal Grandmother    Hypertension Cousin    Other Neg Hx     Social History   Tobacco Use   Smoking status: Never   Smokeless tobacco: Never   Tobacco comments:    Says she smokes a hooka Science writer Use   Vaping status: Never Used  Substance Use Topics   Alcohol use: Not Currently    Comment: occ   Drug use: Never    ROS   Objective:   Vitals: BP 119/88 (BP Location: Left Arm)   Pulse (!) 107   Temp 98.7 F (37.1 C) (Oral)   Resp 20   SpO2 95%   Physical Exam Constitutional:      General: She is not in acute  distress.    Appearance: Normal appearance. She is well-developed and normal weight. She is not ill-appearing, toxic-appearing or diaphoretic.  HENT:     Head: Normocephalic and atraumatic.     Right Ear: Tympanic membrane, ear canal and external ear normal. No drainage or tenderness. No middle ear effusion. There is no impacted cerumen. Tympanic membrane is not erythematous or bulging.     Left Ear: Tympanic membrane, ear canal and external ear normal. No drainage or tenderness.  No middle ear effusion. There is no impacted cerumen. Tympanic membrane is not erythematous or bulging.     Nose: Nose normal. No congestion or rhinorrhea.     Mouth/Throat:     Mouth: Mucous membranes are moist. No oral lesions.     Pharynx: No  pharyngeal swelling, oropharyngeal exudate, posterior oropharyngeal erythema or uvula swelling.     Tonsils: No tonsillar exudate or tonsillar abscesses.  Eyes:     General: No scleral icterus.       Right eye: No discharge.        Left eye: No discharge.     Extraocular Movements: Extraocular movements intact.     Right eye: Normal extraocular motion.     Left eye: Normal extraocular motion.     Conjunctiva/sclera: Conjunctivae normal.  Cardiovascular:     Rate and Rhythm: Normal rate and regular rhythm.     Heart sounds: Normal heart sounds. No murmur heard.    No friction rub. No gallop.  Pulmonary:     Effort: Pulmonary effort is normal. No respiratory distress.     Breath sounds: No stridor. No wheezing, rhonchi or rales.  Chest:     Chest wall: No tenderness.  Musculoskeletal:     Cervical back: Normal range of motion and neck supple.  Lymphadenopathy:     Cervical: No cervical adenopathy.  Skin:    General: Skin is warm and dry.  Neurological:     General: No focal deficit present.     Mental Status: She is alert and oriented to person, place, and time.  Psychiatric:        Mood and Affect: Mood normal.        Behavior: Behavior normal.    Results for orders placed or performed during the hospital encounter of 06/06/24 (from the past 24 hours)  POC SARS Coronavirus 2 Ag     Status: Abnormal   Collection Time: 06/06/24 11:26 AM  Result Value Ref Range   SARS Coronavirus 2 Ag Positive (A) Negative    Assessment and Plan :   PDMP not reviewed this encounter.  1. COVID-19 virus infection   2. Encounter for screening for COVID-19    Deferred imaging given clear cardiopulmonary exam, hemodynamically stable vital signs.  Will manage COVID-19 with supportive care. Counseled patient on nature of COVID-19 including modes of transmission, diagnostic testing, management and supportive care.  Offered symptomatic relief. Counseled patient on potential for adverse effects with  medications prescribed/recommended today, strict ER and return-to-clinic precautions discussed, patient verbalized understanding.     Christopher Savannah, [place] 06/06/24 1135

## 2024-06-07 ENCOUNTER — Ambulatory Visit
Admission: EM | Admit: 2024-06-07 | Discharge: 2024-06-07 | Disposition: A | Discharge status: Home / Self Care | Attending: Family Medicine | Admitting: Family Medicine

## 2024-06-07 DIAGNOSIS — H1031 Unspecified acute conjunctivitis, right eye: Secondary | ICD-10-CM

## 2024-06-07 MED ORDER — POLYMYXIN B-TRIMETHOPRIM 10000-0.1 UNIT/ML-% OP SOLN
1.0000 [drp] | OPHTHALMIC | 0 refills | Status: AC
Start: 2024-06-07 — End: 2024-06-12

## 2024-06-07 NOTE — Discharge Instructions (Signed)
 Start Polytrim  antibiotic eyedrops every 4 hours for the next 2 days.  If you notice your symptoms are not improving you may stop this and start over-the-counter antihistamine eyedrop such as Clear Eyes.  Warm compresses to the eye as needed.  Lots of rest.  Please follow-up with your PCP if your symptoms do not improve.  Please go to the ER for any worsening symptoms.  I hope you feel better soon!

## 2024-06-07 NOTE — ED Provider Notes (Signed)
 [facility]    CSN: [id number] Arrival date & time: 06/07/24  0825      History   Chief Complaint Chief Complaint  Patient presents with   Eye Problem    HPI Annette Jones is a 38 y.o. female presents for eye redness.  Patient reports yesterday she developed right eye redness/irritation with goopy/watery drainage as well as itching.  Denies any foreign body sensation, photophobia, injury to the eye, visual changes.  No glasses or contacts.  Was seen yesterday for 4 days of URI symptoms and was positive for COVID.  No other concerns at this time.   Eye Problem Associated symptoms: discharge, itching and redness     Past Medical History:  Diagnosis Date   Borderline diabetes    Cellulitis of lower leg 2012   left   Chlamydia 2009   CHLAMYDIA   Elevated hemoglobin A1c 04/14/2012   5.9 at NOB interview--plan early glucola at 18 weeks.    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   Pre-diabetes    UTI (urinary tract infection) following delivery 10/25/2012    Past Surgical History:  Procedure Laterality Date   CHOLECYSTECTOMY N/A 11/11/2021   Procedure: LAPAROSCOPIC CHOLECYSTECTOMY WITH ICG;  Surgeon: Tanda Locus, MD;  Location: [facility] OR;  Service: General;  Laterality: N/A;   INDUCED ABORTION      OB History     Gravida  2   Para  1   Term  1   Preterm      AB  1   Living  1      SAB      IAB  0   Ectopic      Multiple      Live Births  1            Home Medications     Prior to Admission medications   Medication Sig Start Date End Date Taking? Authorizing Provider  trimethoprim -polymyxin b  (POLYTRIM ) ophthalmic solution Place 1 drop into the right eye every 4 (four) hours for 5 days. 06/07/24 06/12/24 Yes Nasire Reali, Jodi R, NP  benzonatate  (TESSALON ) 200 MG capsule Take 1 capsule (200 mg total) by mouth 3 (three) times daily as needed. 11/30/23   Muzammil Bruins, Jodi R, NP  cetirizine  (ZYRTEC  ALLERGY) 10 MG tablet Take 1 tablet (10 mg total) by mouth daily. 06/06/24   Christopher Savannah, PA-C  cyclobenzaprine  (FLEXERIL ) 10 MG tablet Take 1 tablet (10 mg total) by mouth 2 (two) times daily as needed for muscle spasms. 02/16/23   Victor Lynwood DASEN, PA-C  doxycycline  (VIBRAMYCIN ) 100 MG capsule Take 1 capsule (100 mg total) by mouth 2 (two) times daily. 10/12/22   Christopher Savannah, PA-C  hydrochlorothiazide (HYDRODIURIL) 25 MG tablet Take 25 mg by mouth every morning.    [provider]  hydrOXYzine  (ATARAX ) 25 MG tablet Take 0.5-1 tablets (12.5-25 mg total) by mouth every 8 (eight) hours as needed for itching. 10/12/22   Christopher Savannah, PA-C  ibuprofen  (ADVIL ) 800 MG tablet Take  1 tablet (800 mg total) by mouth 3 (three) times daily. 06/06/24   Christopher Savannah, PA-C  ipratropium (ATROVENT ) 0.03 % nasal spray Place 2 sprays into both nostrils 2 (two) times daily. 06/06/24   Christopher Savannah, PA-C  levonorgestrel (MIRENA) 20 MCG/24HR IUD 1 each by Intrauterine route once.    [provider]  losartan  (COZAAR ) 25 MG tablet Take 2 tablets (50 mg total) by mouth daily. 07/20/22   Yolande Lamar BROCKS, MD  metFORMIN (GLUCOPHAGE-XR) 500 MG 24 hr tablet Take 500 mg by mouth at bedtime. 09/16/22   [provider]  oseltamivir  (TAMIFLU ) 75 MG capsule Take 1 capsule (75 mg total) by mouth every 12 (twelve) hours. 11/30/23   Dorenda Pfannenstiel, Jodi R, NP  potassium chloride  SA (KLOR-CON  M) 20 MEQ tablet Take 1 tablet (20 mEq total) by mouth 2 (two) times daily for 3 days. 02/16/23 02/19/23  Victor Lynwood DASEN, PA-C   promethazine -dextromethorphan (PROMETHAZINE -DM) 6.25-15 MG/5ML syrup Take 5 mLs by mouth 3 (three) times daily as needed for cough. 06/06/24   Christopher Savannah, PA-C    Family History Family History  Problem Relation Age of Onset   Hypertension Mother    Diabetes Maternal Grandmother    Hypertension Maternal Grandmother    Hypertension Cousin    Other Neg Hx     Social History Social History   Tobacco Use   Smoking status: Never   Smokeless tobacco: Never   Tobacco comments:    Says she smokes a hooka Chartered loss adjuster   Vaping status: Never Used  Substance Use Topics   Alcohol use: Not Currently    Comment: occ   Drug use: Never     Allergies   Percocet [oxycodone -acetaminophen ]   Review of Systems Review of Systems  Eyes:  Positive for discharge, redness and itching.     Physical Exam Triage Vital Signs ED Triage Vitals  Encounter Vitals Group     BP 06/07/24 0839 115/89     Girls Systolic BP Percentile --      Girls Diastolic BP Percentile --      Boys Systolic BP Percentile --      Boys Diastolic BP Percentile --      Pulse Rate 06/07/24 0838 69     Resp 06/07/24 0838 18     Temp 06/07/24 0838 98.9 F (37.2 C)     Temp Source 06/07/24 0838 Oral     SpO2 06/07/24 0838 98 %     Weight --      Height --      Head Circumference --      Peak Flow --      Pain Score 06/07/24 0839 0     Pain Loc --      Pain Education --      Exclude from Growth Chart --    No data found.  Updated Vital Signs BP 115/89   Pulse 69   Temp 98.9 F (37.2 C) (Oral)   Resp 18   SpO2 98%   Visual Acuity Right Eye Distance:   Left Eye Distance:   Bilateral Distance:    Right Eye Near:   Left Eye Near:    Bilateral Near:     Physical Exam Vitals and nursing note reviewed.  Constitutional:      General: She is not in acute distress.    Appearance: Normal appearance. She is not ill-appearing.  HENT:     Head: Normocephalic and atraumatic.  Nose:  Congestion present.  Eyes:     General: Lids are normal.        Right eye: No foreign body, discharge or hordeolum.     Extraocular Movements: Extraocular movements intact.     Conjunctiva/sclera:     Right eye: Right conjunctiva is injected. No chemosis, exudate or hemorrhage.    Pupils: Pupils are equal, round, and reactive to light.     Comments: Dried crusty drainage on lower eyelash line  Cardiovascular:     Rate and Rhythm: Normal rate.  Pulmonary:     Effort: Pulmonary effort is normal.  Skin:    General: Skin is warm and dry.  Neurological:     General: No focal deficit present.     Mental Status: She is alert and oriented to person, place, and time.  Psychiatric:        Mood and Affect: Mood normal.        Behavior: Behavior normal.      UC Treatments / Results  Labs (all labs ordered are listed, but only abnormal results are displayed) Labs Reviewed - No data to display  EKG   Radiology No results found.  Procedures Procedures (including critical care time)  Medications Ordered in UC Medications - No data to display  Initial Impression / Assessment and Plan / UC Course  I have reviewed the triage vital signs and the nursing notes.  Pertinent labs & imaging results that were available during my care of the patient were reviewed by me and considered in my medical decision making (see chart for details).      Reviewed exam and symptoms with patient.  No red flags.  Discussed viral versus bacterial conjunctivitis.  She is reporting purulent type drainage will cover for bacterial with Polytrim  x 2 days.  Instructed if no improvement to stop this after 2 days and start antihistamine eyedrops like Clear Eyes.  Warm compresses as needed.  PCP follow-up if symptoms do not improve.  ER precautions reviewed Final Clinical Impressions(s) / UC Diagnoses   Final diagnoses:  Acute conjunctivitis of right eye, unspecified acute conjunctivitis type     Discharge  Instructions      Start Polytrim  antibiotic eyedrops every 4 hours for the next 2 days.  If you notice your symptoms are not improving you may stop this and start over-the-counter antihistamine eyedrop such as Clear Eyes.  Warm compresses to the eye as needed.  Lots of rest.  Please follow-up with your PCP if your symptoms do not improve.  Please go to the ER for any worsening symptoms.  I hope you feel better soon!    ED Prescriptions     Medication Sig Dispense Auth. Provider   trimethoprim -polymyxin b  (POLYTRIM ) ophthalmic solution Place 1 drop into the right eye every 4 (four) hours for 5 days. 10 mL Amaris Garrette, Jodi R, NP      PDMP not reviewed this encounter.   Annette Myla SAUNDERS, NP 06/07/24 [phone]

## 2024-06-07 NOTE — ED Triage Notes (Signed)
 Pt present with rt eye irritation and itchiness x last night. States her eyes were crusty. Pt states she has not applied eye drops.
